# Patient Record
Sex: Female | Born: 1955 | Race: White | Hispanic: No | Marital: Married | Smoking: Never smoker
Health system: Southern US, Academic
[De-identification: ages and names within clinical notes are randomized; demographics above are authoritative.]

## PROBLEM LIST (undated history)

## (undated) DIAGNOSIS — J309 Allergic rhinitis, unspecified: Secondary | ICD-10-CM

## (undated) HISTORY — DX: Allergic rhinitis, unspecified: J30.9

---

## 1984-10-26 ENCOUNTER — Other Ambulatory Visit (HOSPITAL_COMMUNITY): Payer: Self-pay | Admitting: OBSTETRICS/GYNECOLOGY

## 2019-01-06 IMAGING — CR XRAY SINUSES COMPLETE
1 series · 3 of 3 positions shown · non-contrast
Comparison: None available.

EXAM:  XRAY SINUSES COMPLETE
INDICATION: Cough and shortness of breath.

[Series 1: view not recorded · 0.17mm/px · 3 of 3 slices shown]
[im 1/3]
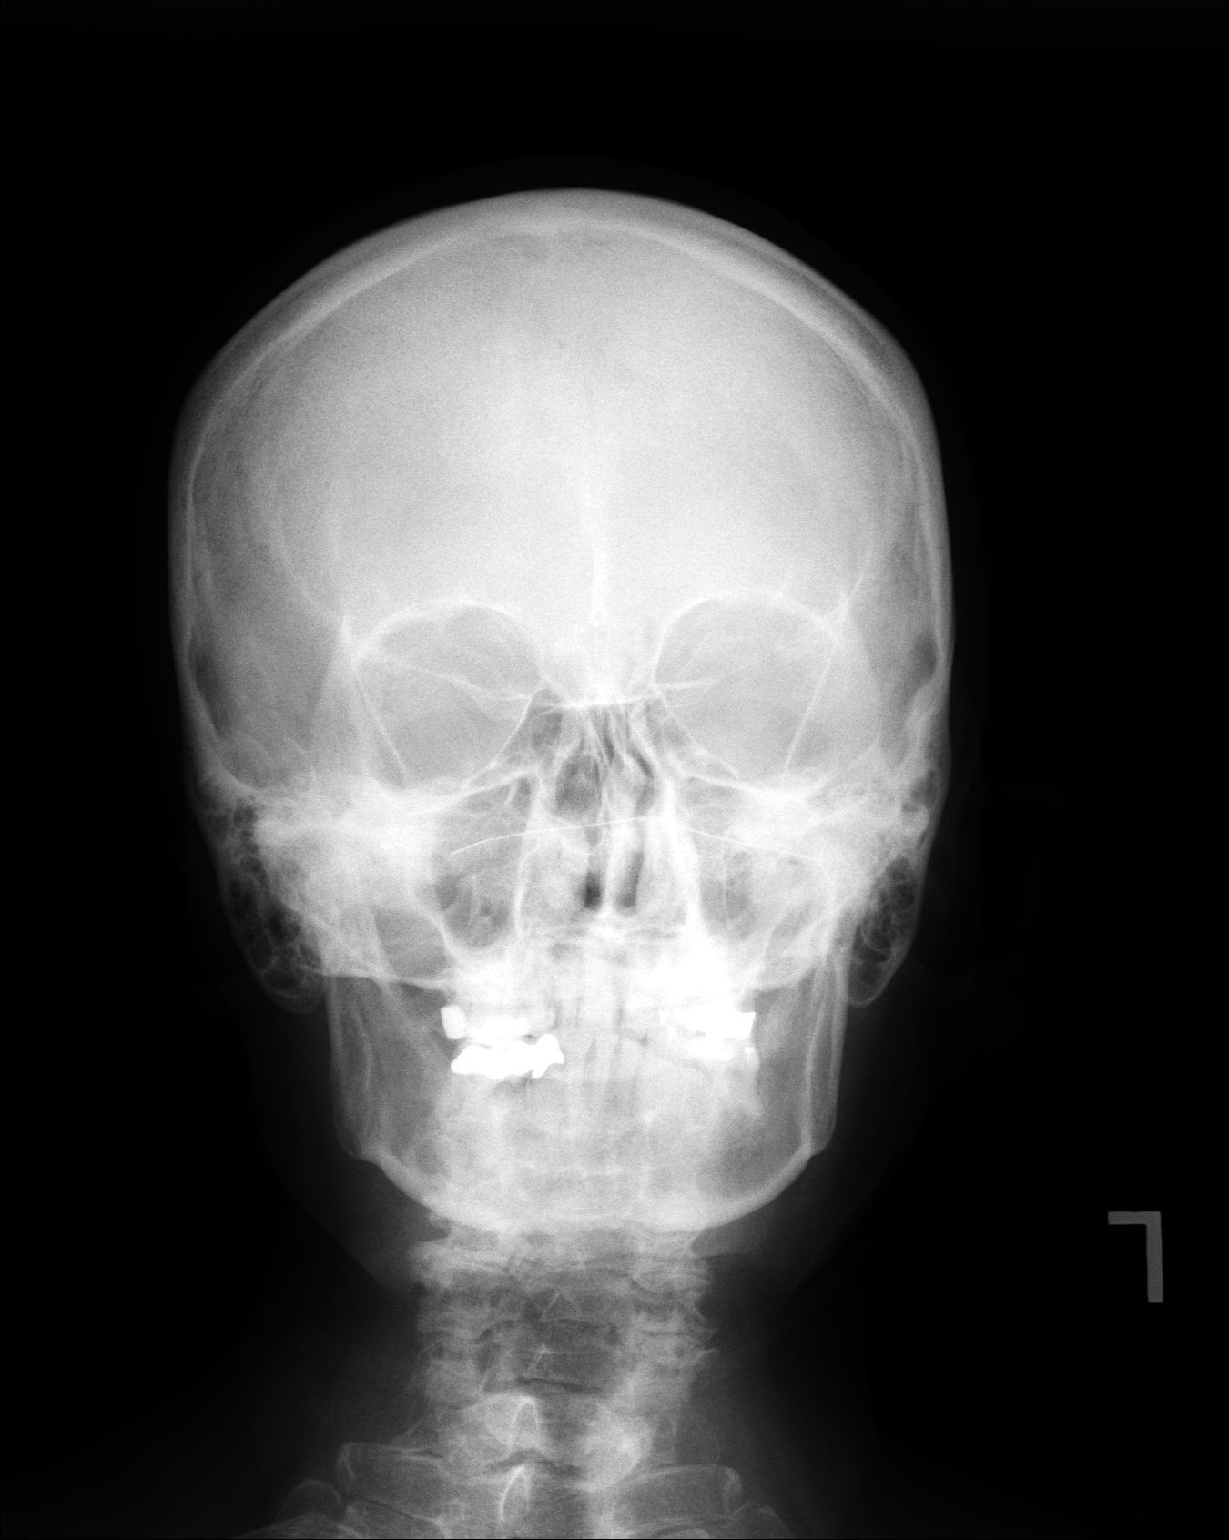
[im 2/3]
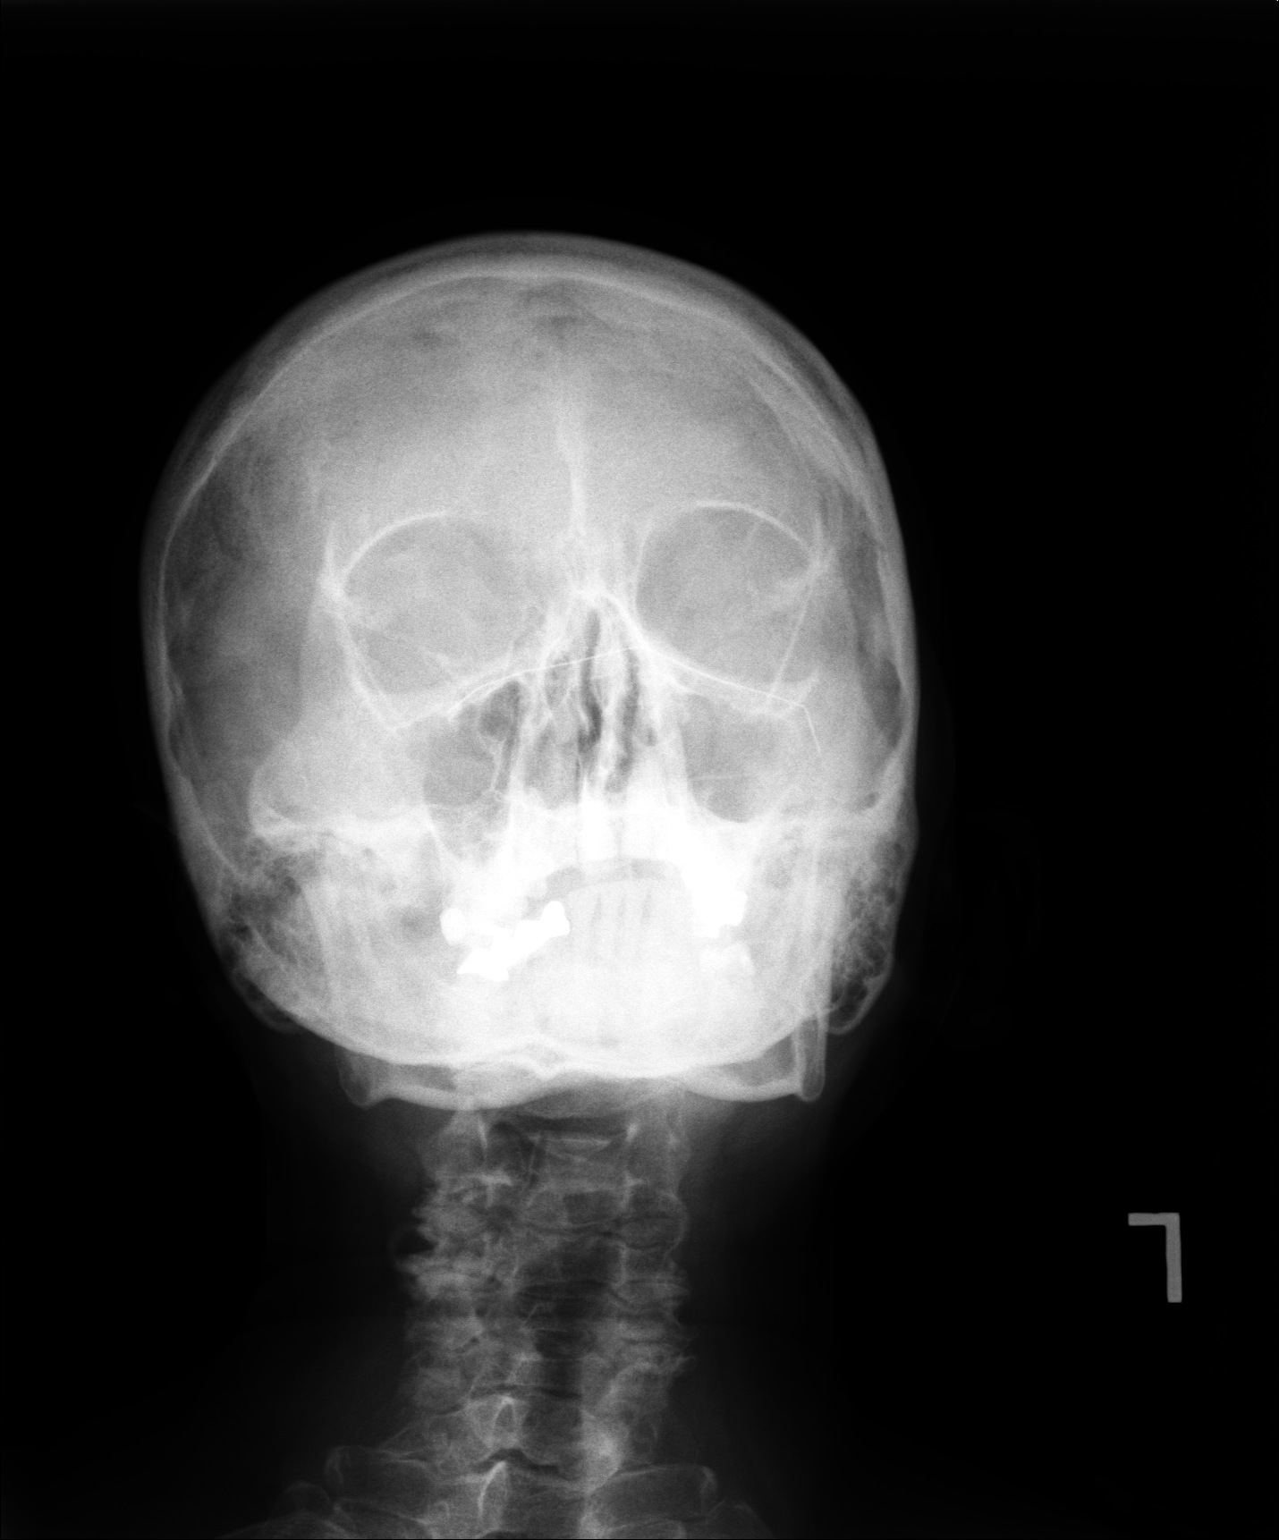
[im 3/3]
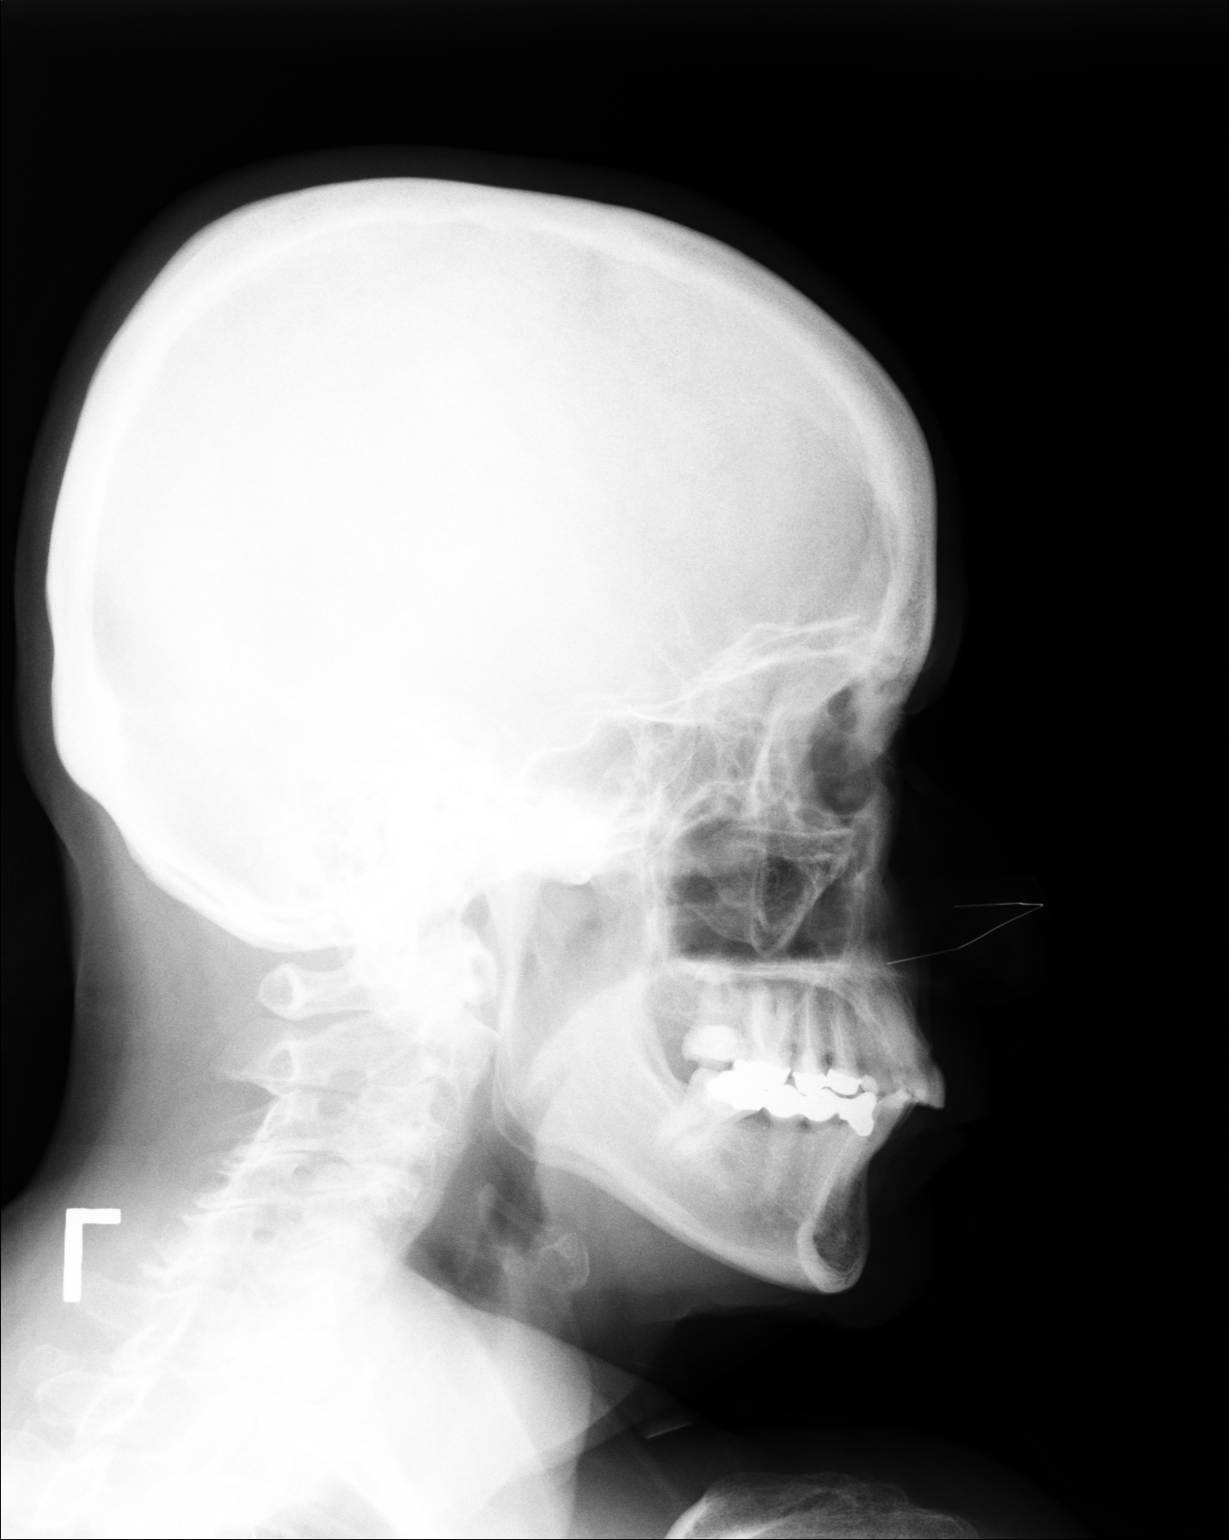

[3 of 3 positions shown; findings below may reference images not displayed]

FINDINGS: Frontal sinuses are congenitally hypoplastic. Remaining paranasal sinuses appear well aerated. There is mild leftward deviation of the nasal septum. Orbital margins are well maintained.
IMPRESSION: Essentially unremarkable exam.

## 2019-01-06 IMAGING — CR CHEST XRAY 2 VIEWS
1 series · 2 of 2 positions shown · non-contrast
Comparison: None available.

EXAM:  CHEST XRAY 2 VIEWS
INDICATION: Asthma.

[Series 3: view not recorded · 0.17mm/px · 2 of 2 slices shown]
[im 1/2]
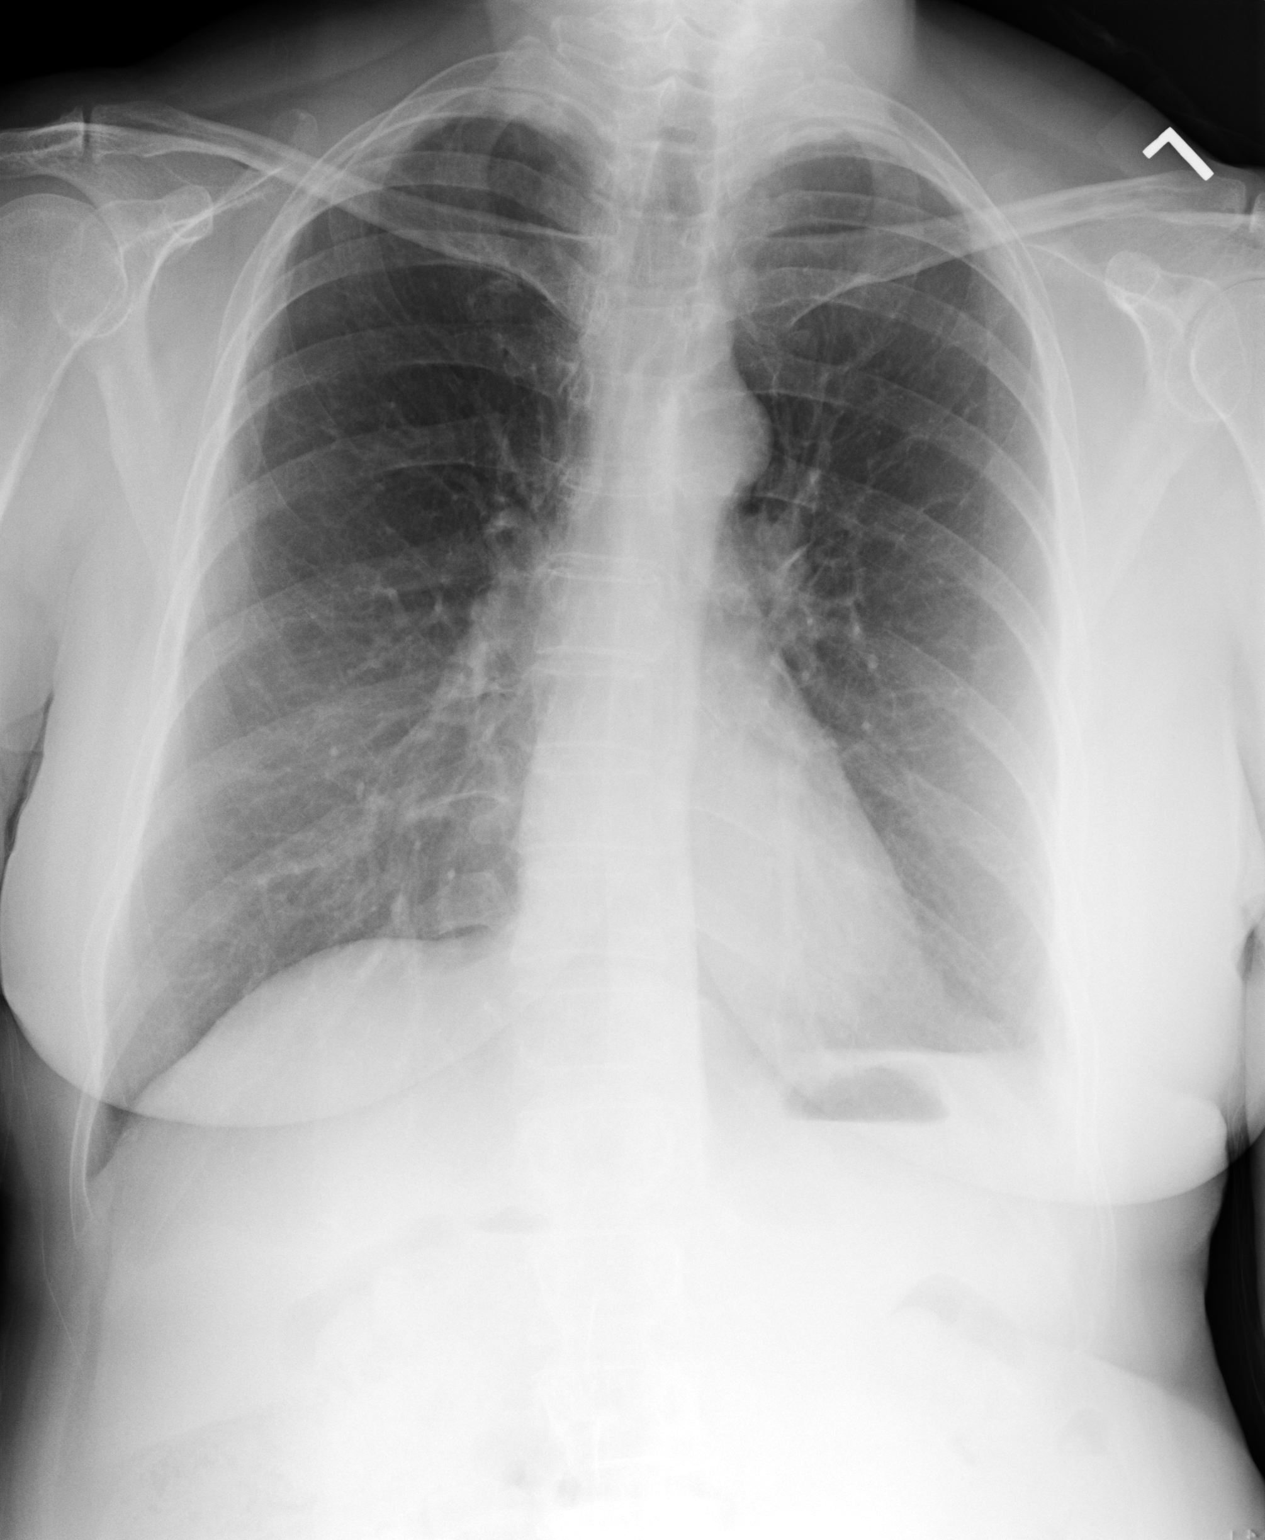
[im 2/2]
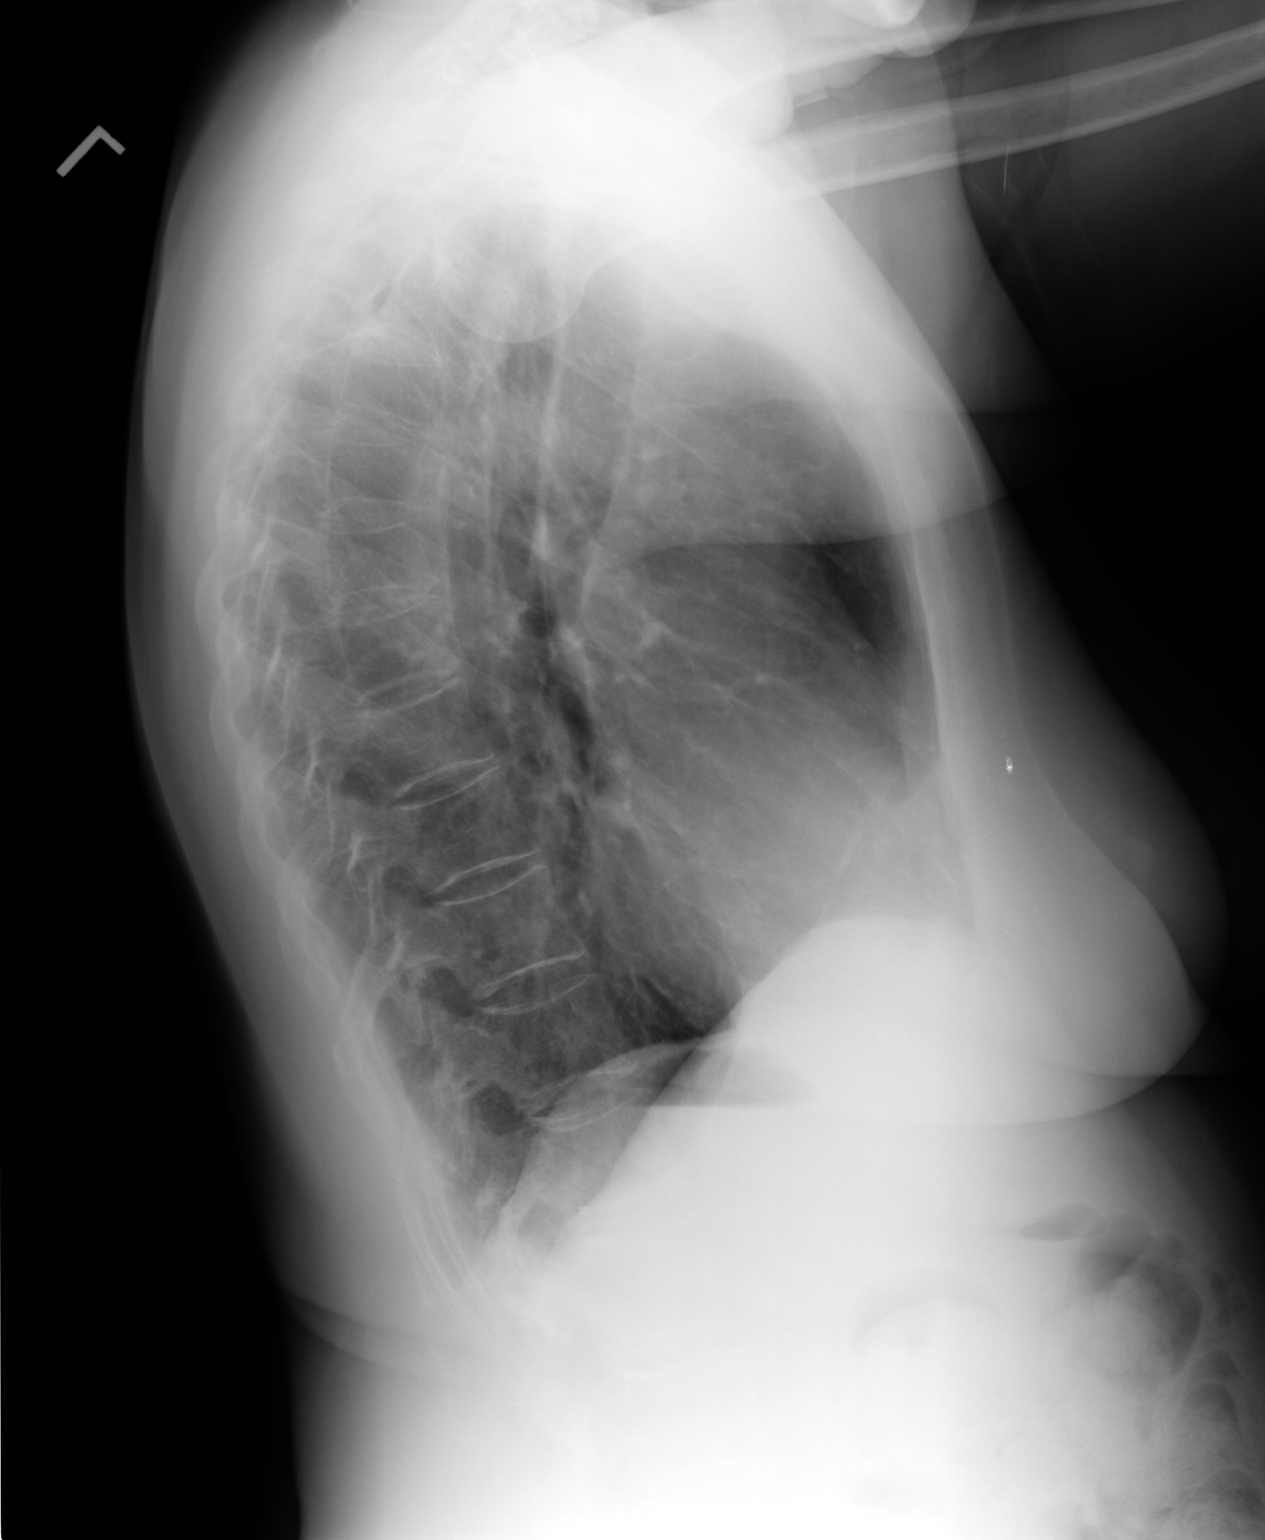

[2 of 2 positions shown; findings below may reference images not displayed]

FINDINGS: There is no consolidation or pleural effusion. Cardiomediastinal silhouette is normal. Visualized upper abdomen and osseous structures are unremarkable.
IMPRESSION: No acute cardiopulmonary abnormality.

## 2019-12-02 IMAGING — MR MRI JOINT UPPER EXTREMITY WITHOUT CONTRAST RT
5 of 6 series · 31 of 40 positions shown · non-contrast
Comparison: None available.

EXAM:  MRI JOINT UPPER EXTREMITY WITHOUT CONTRAST RT SHOULDER
INDICATION: 63 year old female with persistent right shoulder pain and diminished range of motion.  Patient fell from treadmill approximately 4 months ago.  No history of shoulder surgery.
TECHNIQUE: Axial, oblique coronal and oblique sagittal images of the right shoulder as per protocol.

[Series 7: T1 · oblique · right · 4.0mm · 0.31mm/px · 6 of 22 slices shown]
[im 1/22]
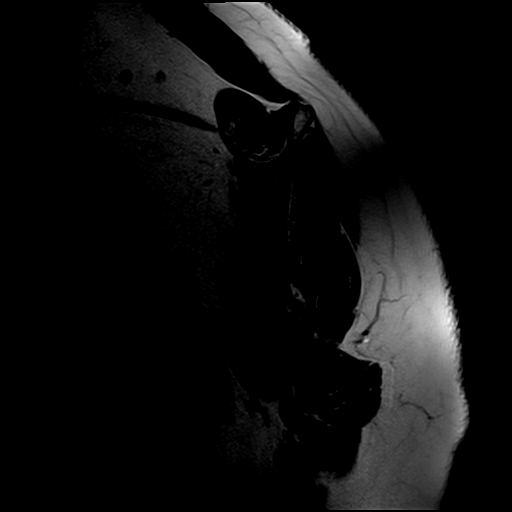
[im 5/22]
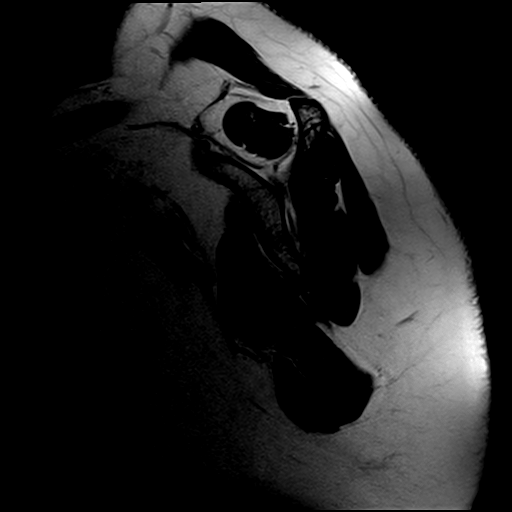
[im 9/22]
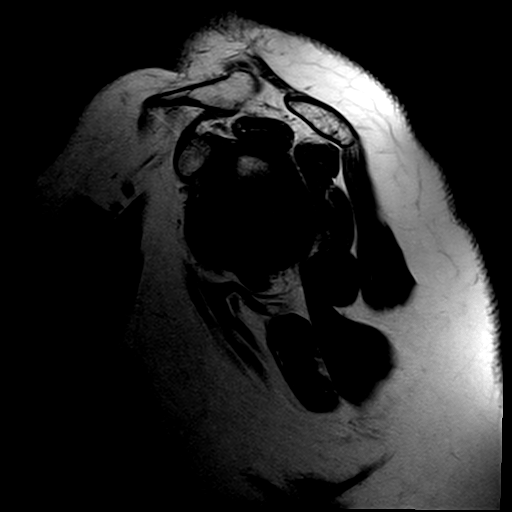
[im 13/22]
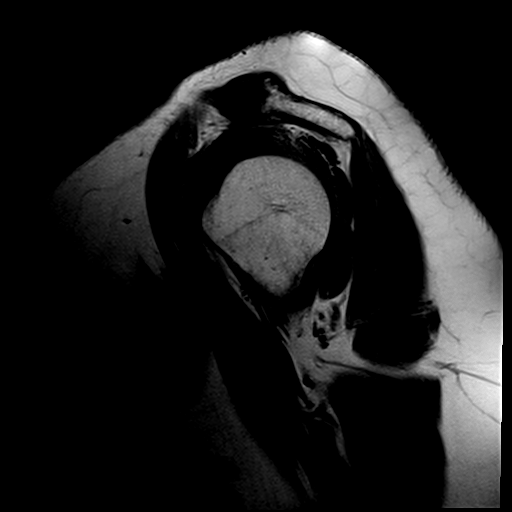
[im 17/22]
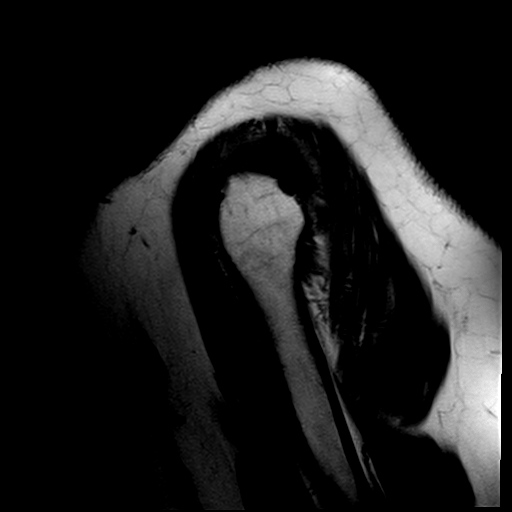
[im 22/22]
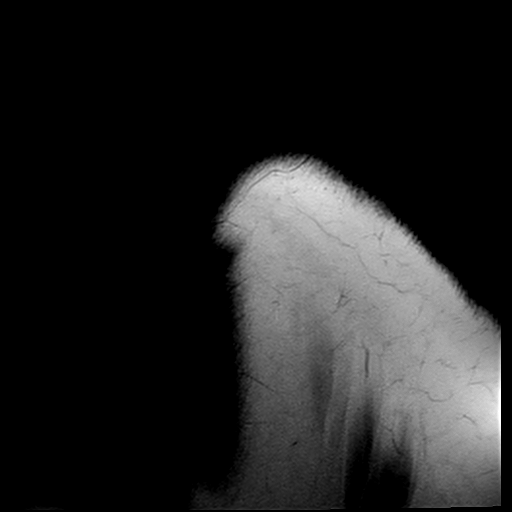

[Series 8: PD fat-sat · axial · right · 4.0mm · 0.36mm/px · z∈[-38,+64]mm · 7 of 24 slices shown (1 of 2)]
[im 1/24]
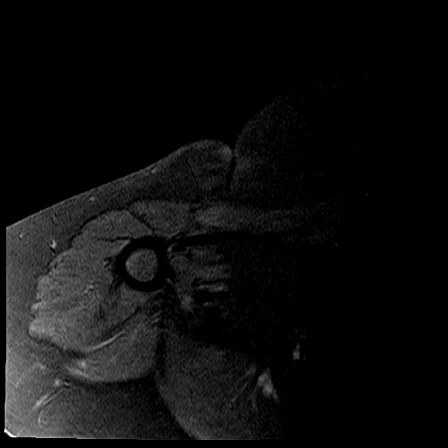
[im 4/24]
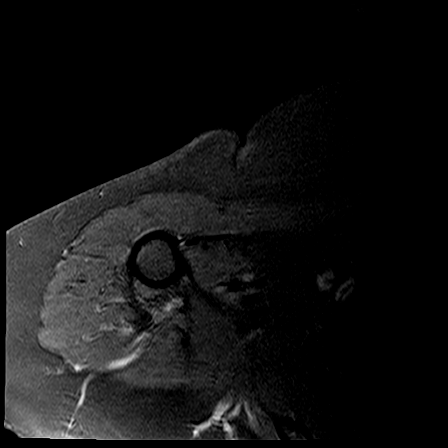
[im 8/24]
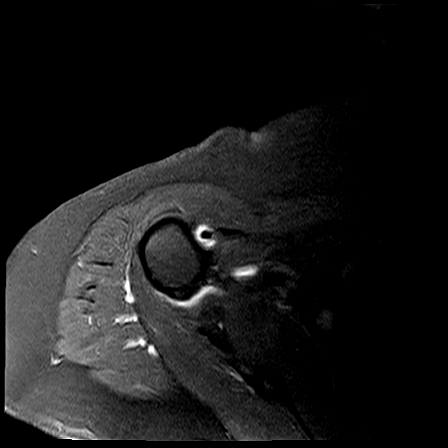
[im 12/24]
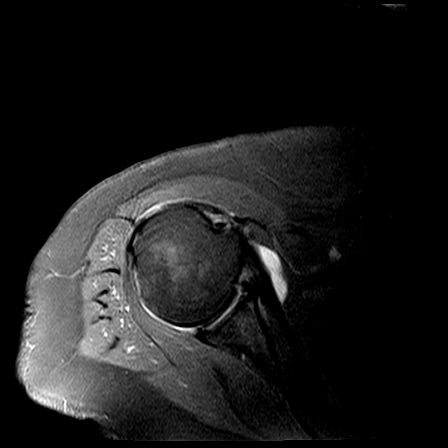
[im 16/24]
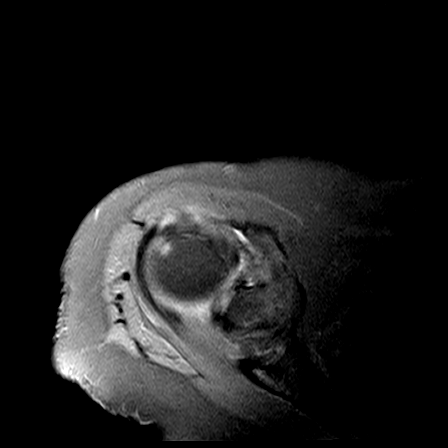
[im 20/24]
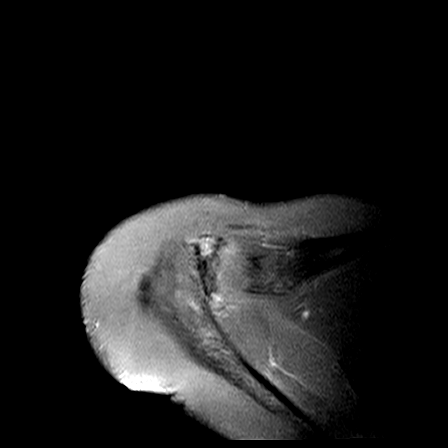
[im 24/24]
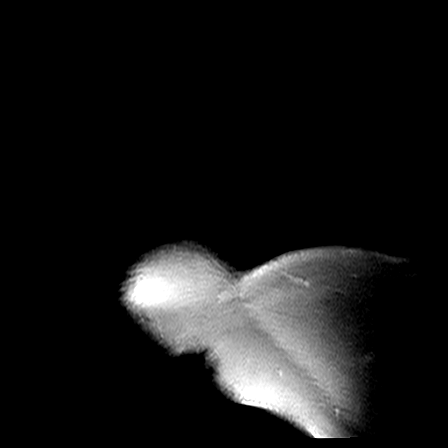

[Series 11: PD fat-sat · oblique · right · 3.5mm · 0.47mm/px · 7 of 23 slices shown (2 of 2)]
[im 1/23]
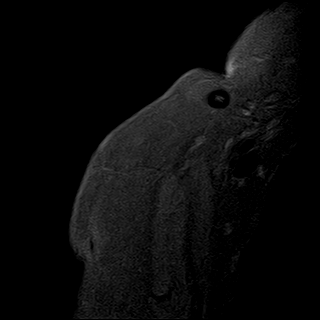
[im 4/23]
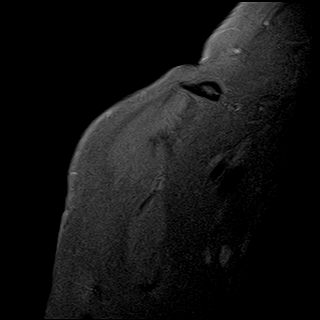
[im 8/23]
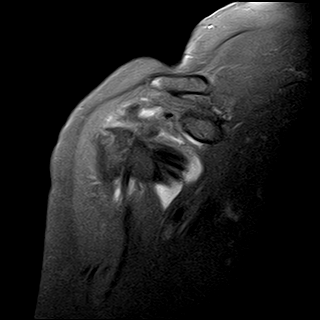
[im 12/23]
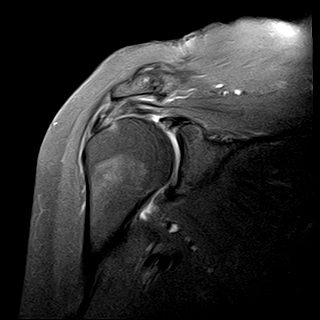
[im 15/23]
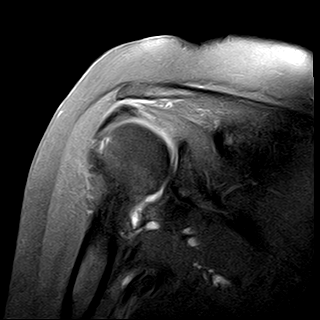
[im 19/23]
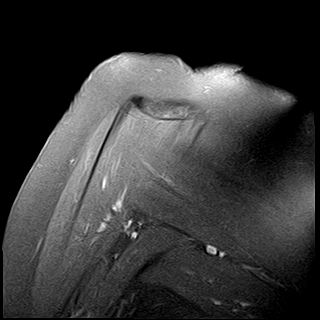
[im 23/23]
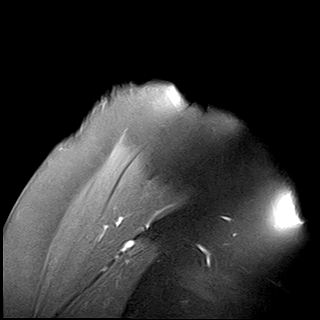

[Series 12: STIR · oblique · right · 3.5mm · 0.52mm/px · 4 of 23 slices shown]
[im 1/23]
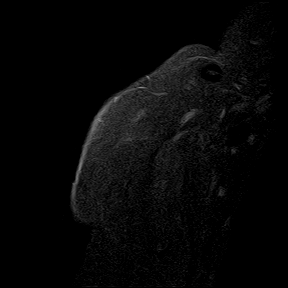
[im 4/23]
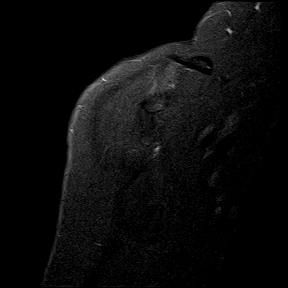
[im 8/23]
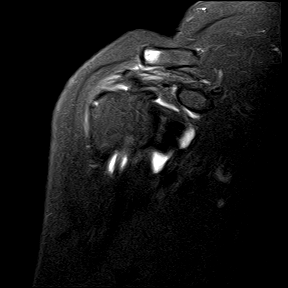
[im 12/23]
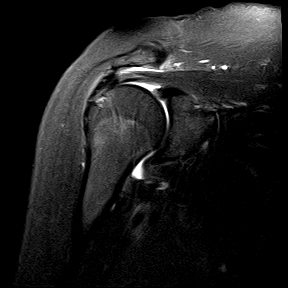

[Series 13: T2 fat-sat · axial · right · 4.0mm · 0.42mm/px · z∈[-38,+64]mm · 7 of 24 slices shown]
[im 1/24]
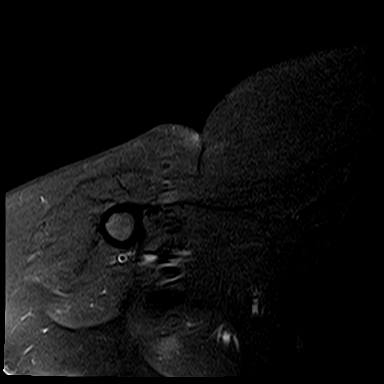
[im 4/24]
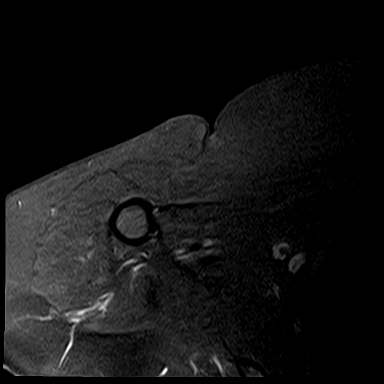
[im 8/24]
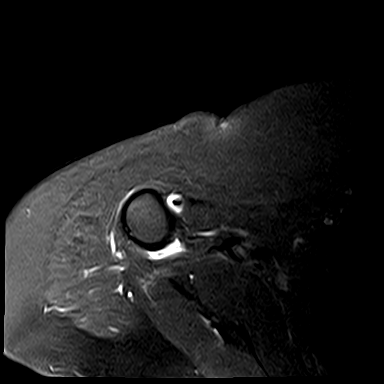
[im 12/24]
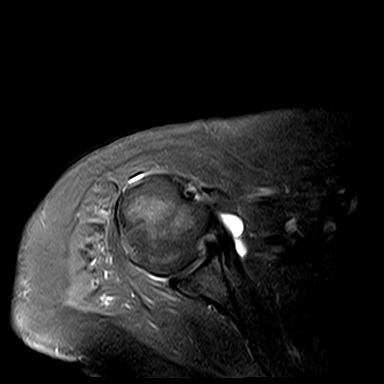
[im 16/24]
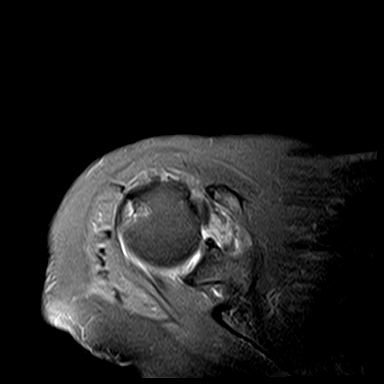
[im 20/24]
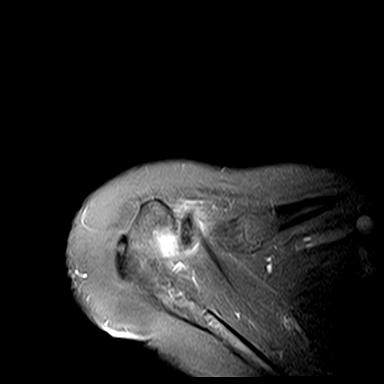
[im 24/24]
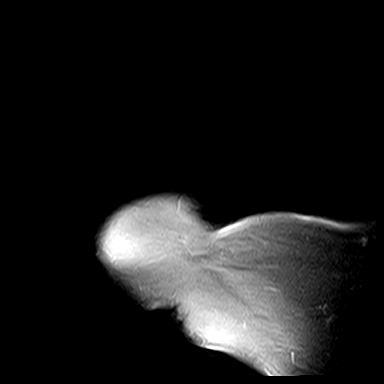

[31 of 40 positions shown; findings below may reference images not displayed]

FINDINGS: Some of the images are compromised by motion artifacts.  

No acute fractures or bone bruises on both sides of the right shoulder.  

Degenerative changes of AC joint is impinging on the subacromion space in the supraspinatus.  Mild supraspinatus tendonitis is noted.  Minimal partial thickness tear on the bursal side of supraspinatus is noted.  

No acute abnormalities of the glenoid labrum.  Biceps tendon is intact.  Fluid in the glenohumeral joint is noted.  No loose bodies are seen.
IMPRESSION: No acute bone changes at the right shoulder.  

Chronic degenerative changes impinging on the subacromion space in the supraspinatus muscle and tendon.

Supraspinatus tendonitis with shallow partial thickness tear on the bursal side.  No full thickness tear are noted.  

Effusion in the glenohumeral joint.  No evidence of labral tear.  Moderate degenerative changes of the glenohumeral articular cartilage and the labrum.

## 2020-08-01 IMAGING — US ABD COMPLETE
1 series · 14 of 25 positions shown · non-contrast
Comparison: None available.

﻿EXAM:  ERXLEBEN PROFESSIONAL READ ABD U/S COMPLETE
INDICATION: R10.84.

[Series 1: abd complete · 14 of 71 slices shown]
[im 1/71]
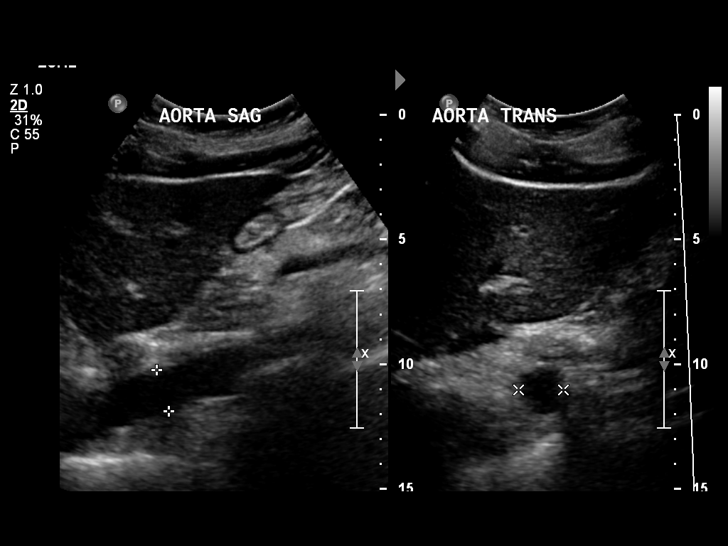
[im 6/71]
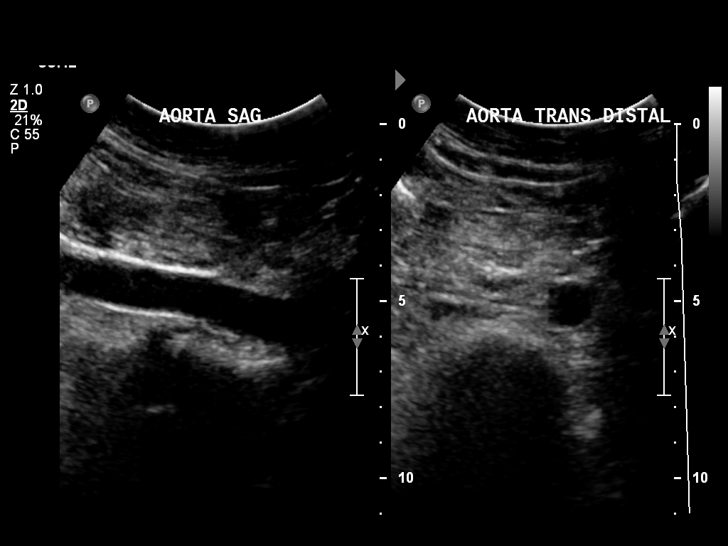
[im 12/71]
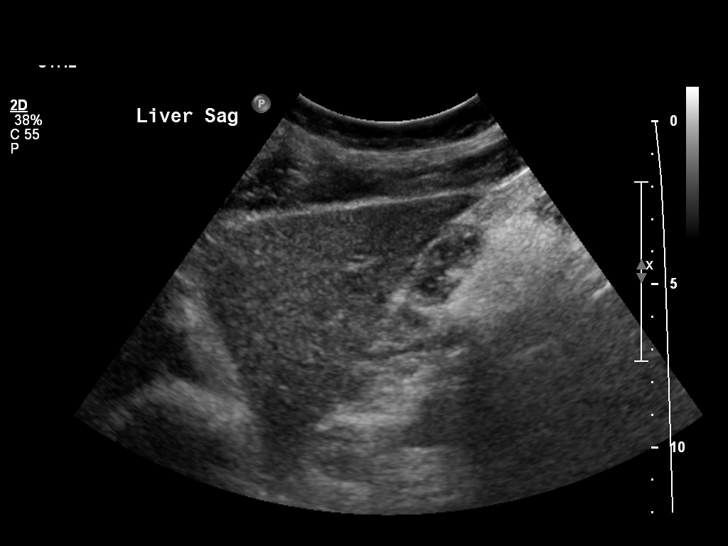
[im 18/71]
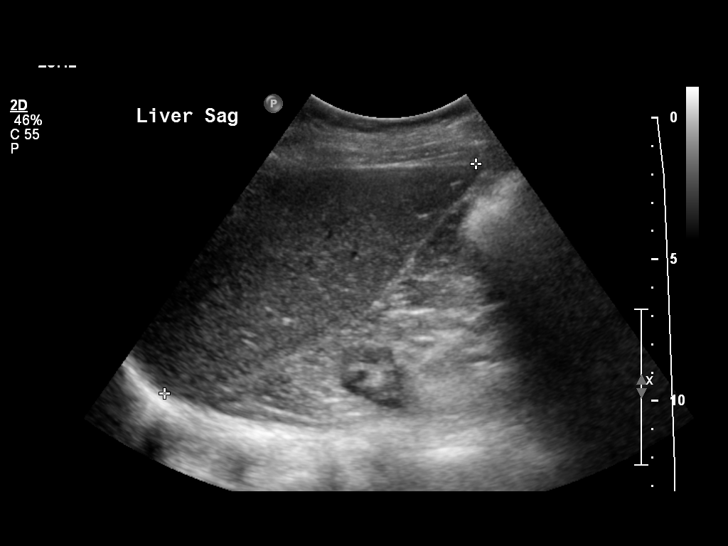
[im 24/71]
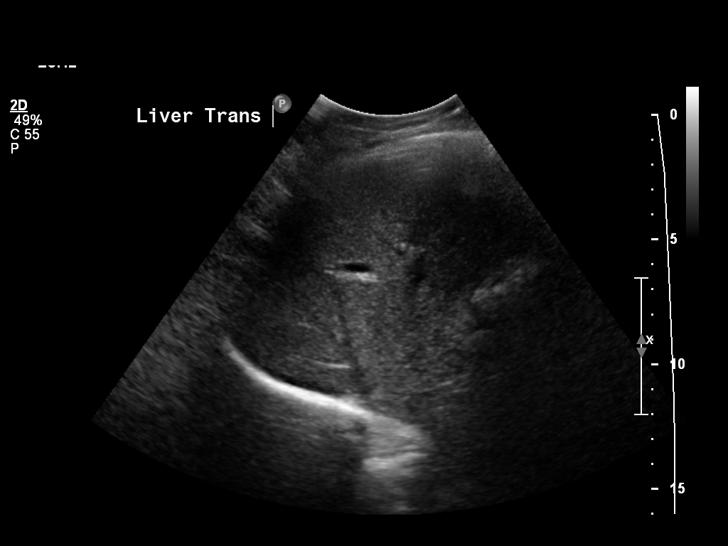
[im 27/71]
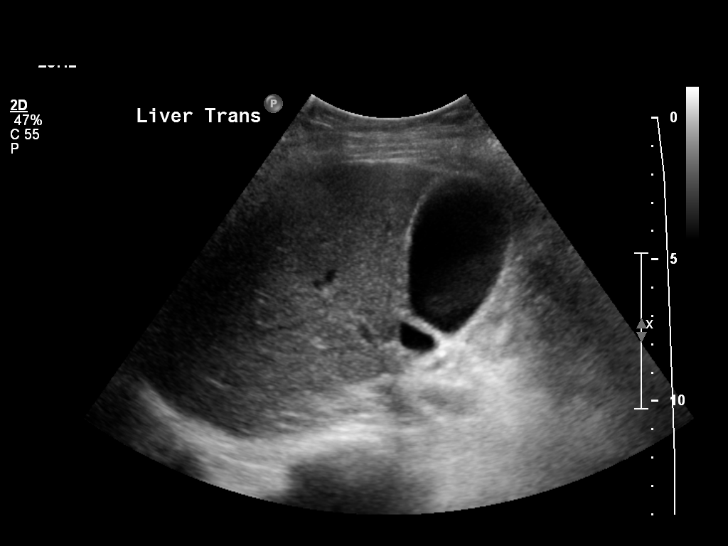
[im 33/71]
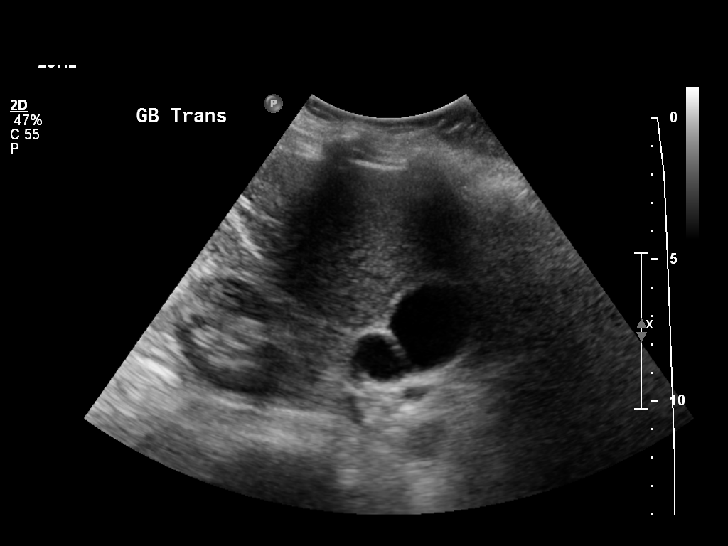
[im 38/71]
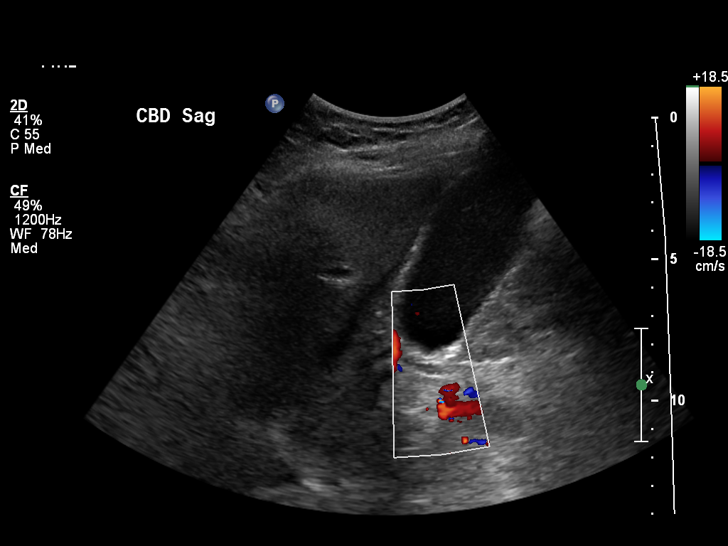
[im 44/71]
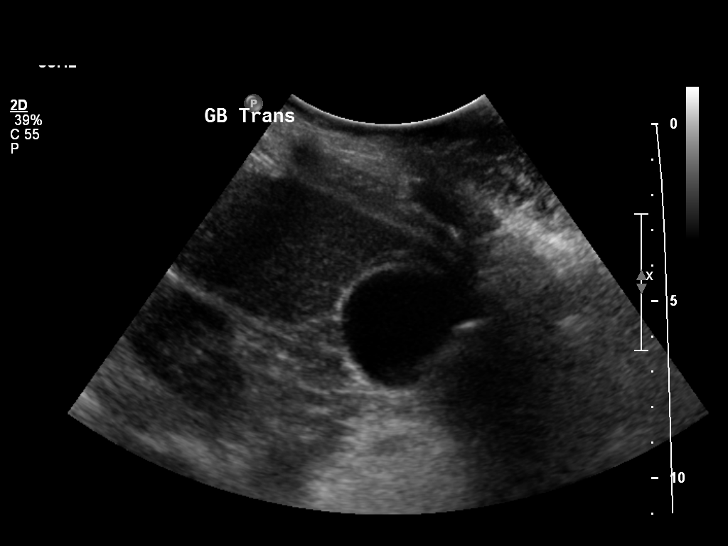
[im 47/71]
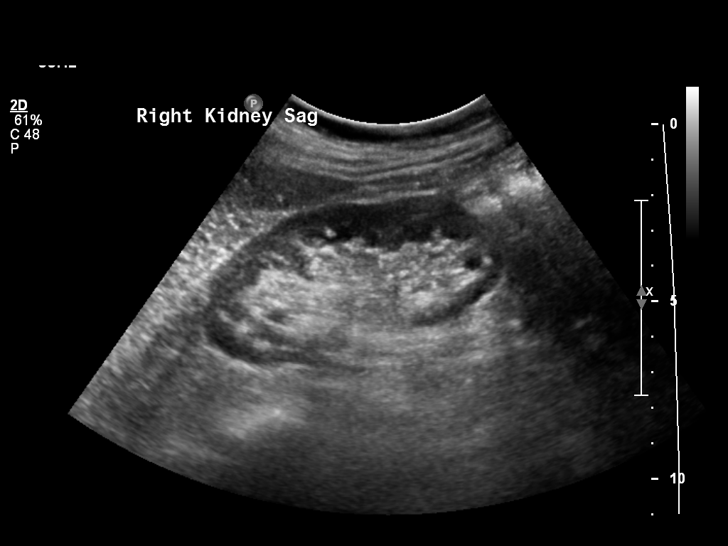
[im 53/71]
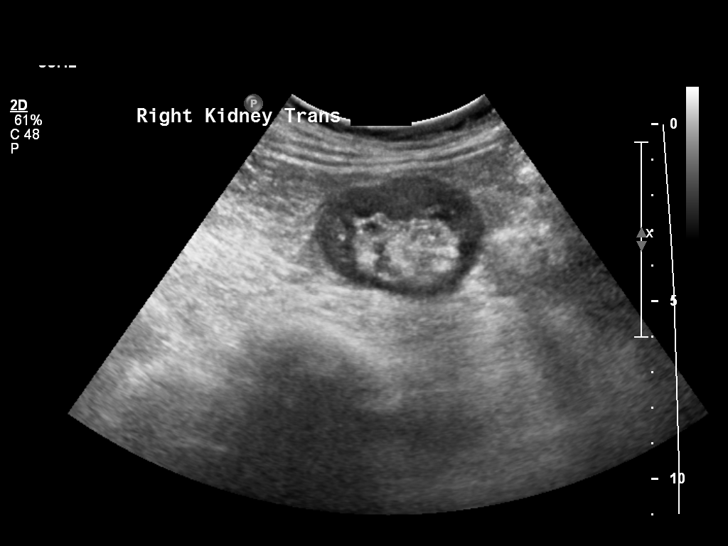
[im 59/71]
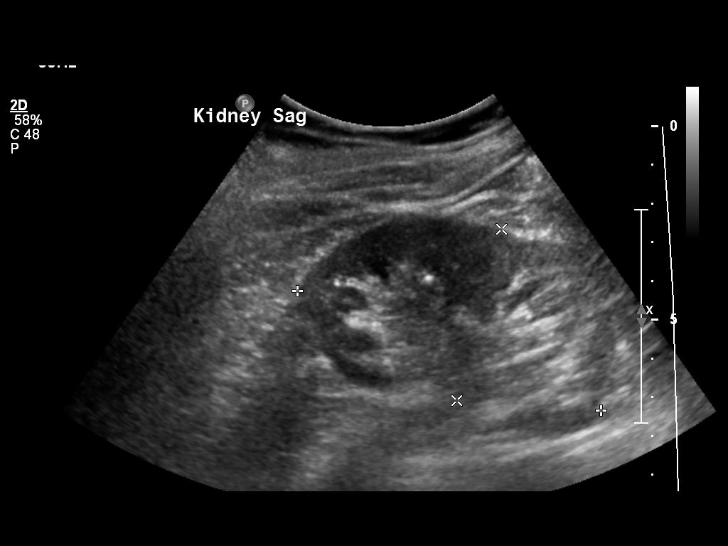
[im 65/71]
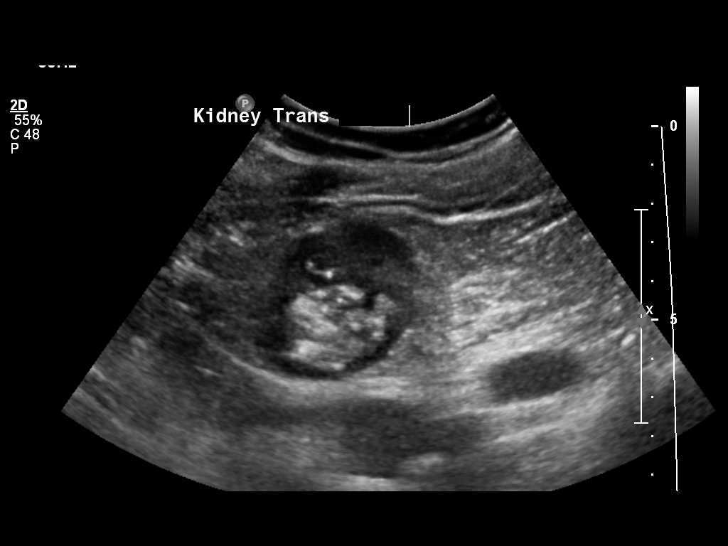
[im 71/71]
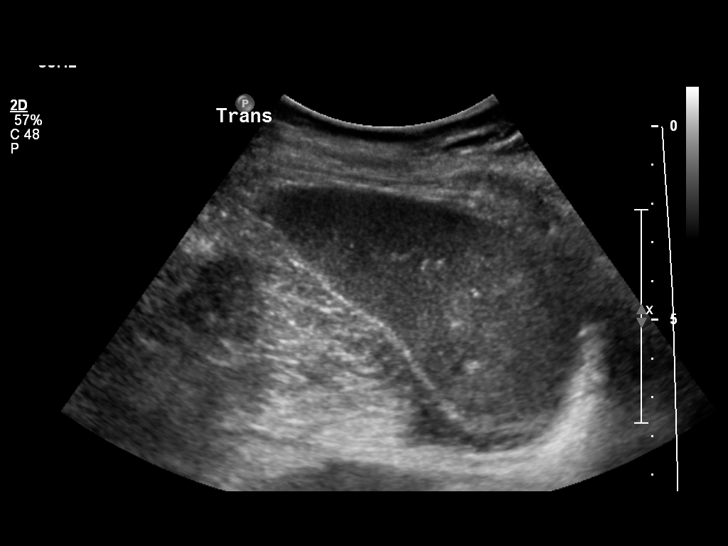

[14 of 25 positions shown; findings below may reference images not displayed]

FINDINGS: Liver is normal in echogenicity. There is no hepatic mass. There is no intra or extrahepatic biliary ductal dilatation. Common bile duct measures 3.5 mm. No sludge or shadowing gallstones are seen. There is no gallbladder wall thickening or pericholecystic fluid. Pancreas is incompletely visualized due to artifact from overlying bowel gas. Spleen measures 9.5 cm and is unremarkable.

 Kidneys are normal in echogenicity and measure 8.5 cm bilaterally. There is no hydronephrosis, mass or cyst on either side.

Visualized abdominal aorta is without aneurysmal dilatation. IVC is normal. Portal vein measures 7 mm in diameter and demonstrates hepatopetal flow. Hepatic veins are also patent. There is no ascites.
IMPRESSION: 1. Unremarkable liver. 

2. No evidence of cholelithiasis or acute cholecystitis. 

3. Pancreas incompletely visualized due to artifact from overlying bowel gas.

## 2020-11-15 IMAGING — CR XRAY SHOULDER MINIMUM 2 VIEW RT
1 series · 3 of 3 positions shown · non-contrast
Comparison: MRI dated 12/02/2019.

﻿EXAM:  XRAY SHOULDER MINIMUM 2 VIEW RT
INDICATION: Shoulder pain.

[Series 1: view not recorded · 0.17mm/px · 3 of 3 slices shown]
[im 1/3]
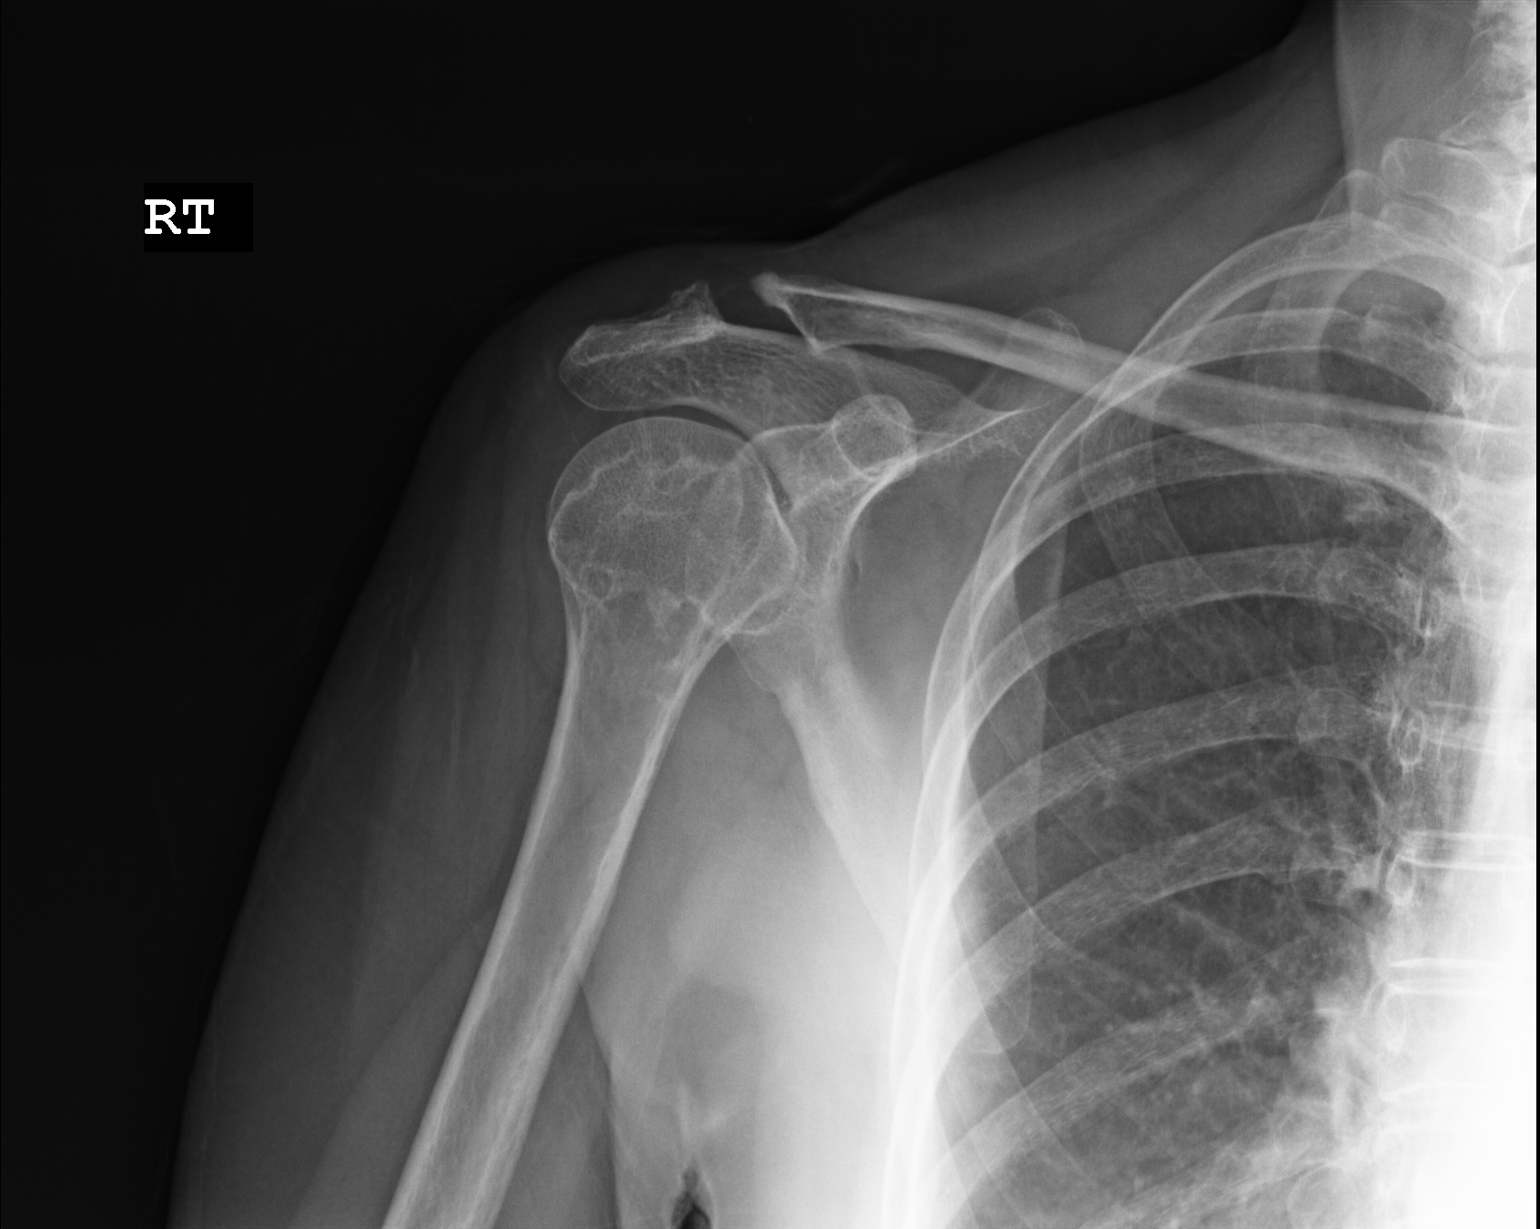
[im 2/3]
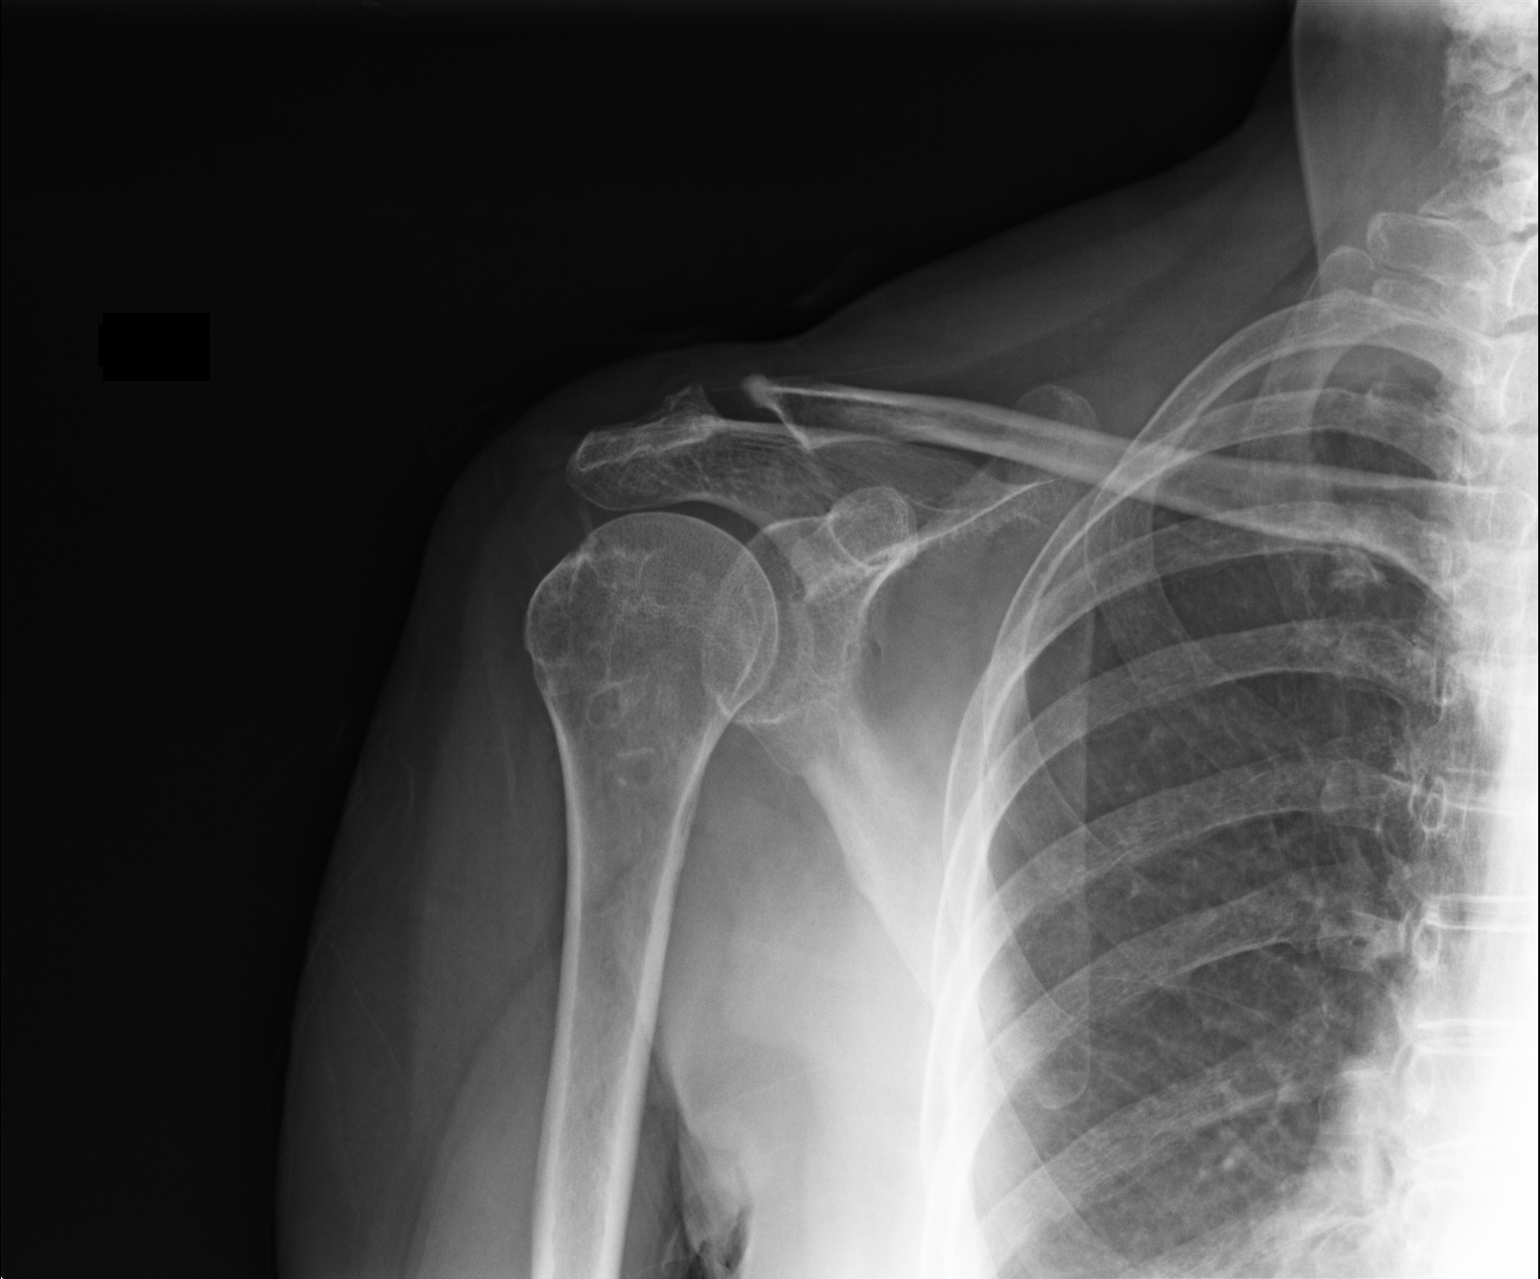
[im 3/3]
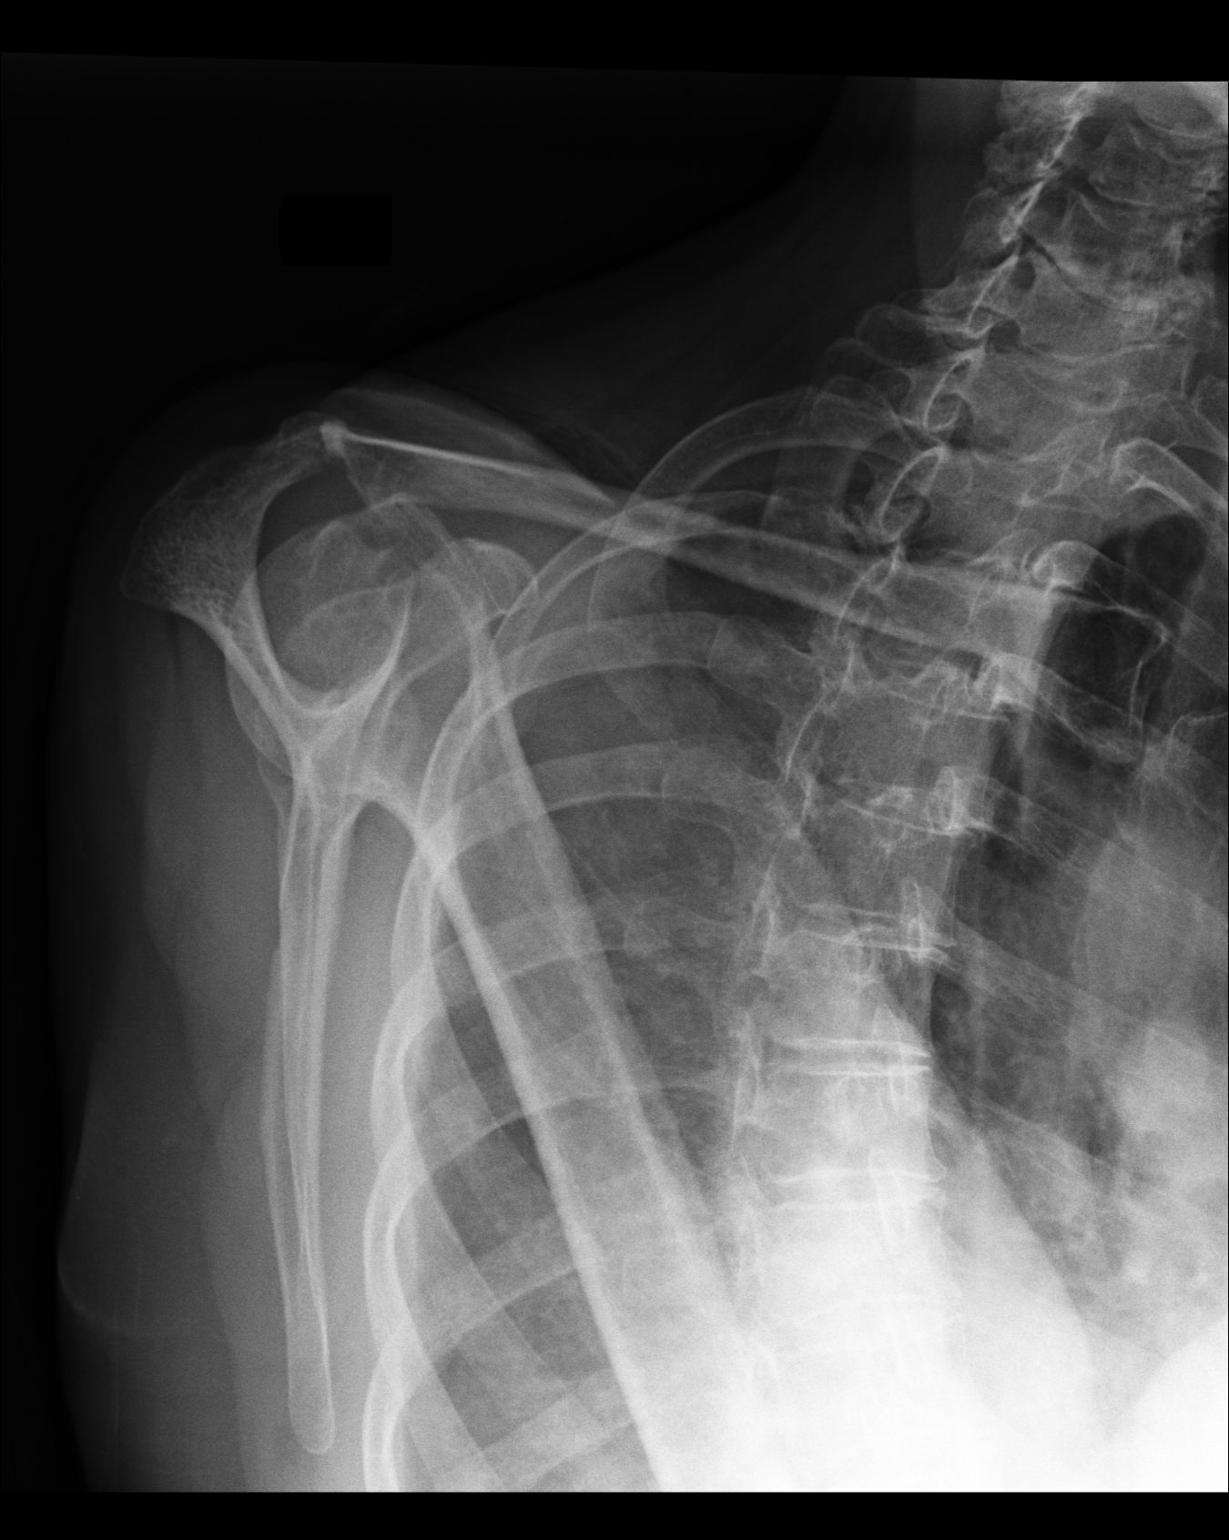

[3 of 3 positions shown; findings below may reference images not displayed]

FINDINGS: There is no acute fracture or subluxation. Glenohumeral articulation is well maintained. There appears to be surgical resection of the distal clavicle. Visualized lung is clear. Surrounding soft tissues are unremarkable.
IMPRESSION: No acute osseous abnormality.

## 2020-12-26 IMAGING — MR ARTHROGRAM MRI RT SHOULDER
5 of 6 series · 28 of 40 positions shown · IV contrast (Gadavist)
Comparison: Radiographs dated 11/15/2020 and MRI dated 12/02/2019.

﻿EXAM:  ARTHROGRAM MRI RT SHOULDER
INDICATION: Recurrent shoulder pain. History of prior surgery.
TECHNIQUE: Multiplanar multisequential MRI of the right shoulder joint was performed following intra-articular administration of 15 mL of diluted Gadavist.

[Series 6: T1 fat-sat · oblique · right · 3.5mm · 0.31mm/px · 6 of 22 slices shown (1 of 3)]
[im 1/22]
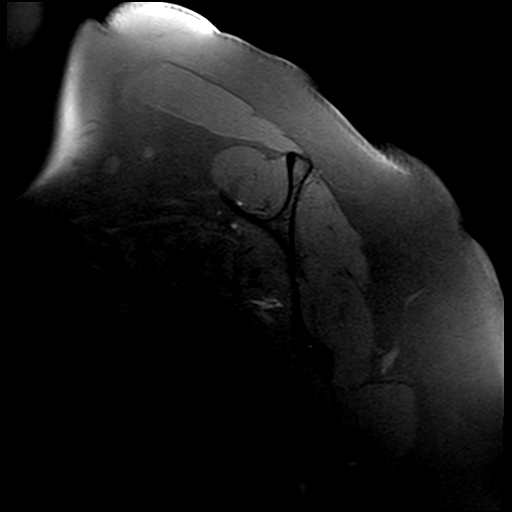
[im 5/22]
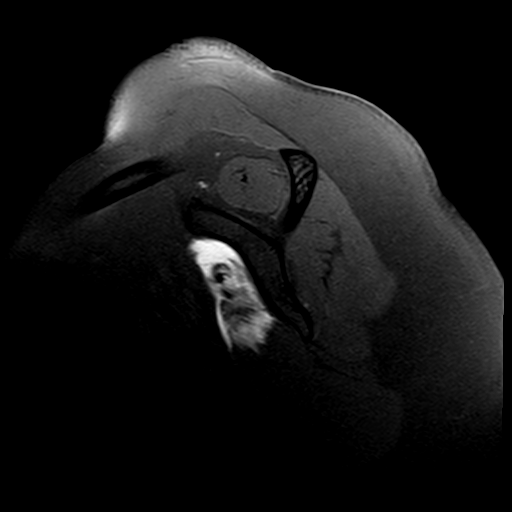
[im 9/22]
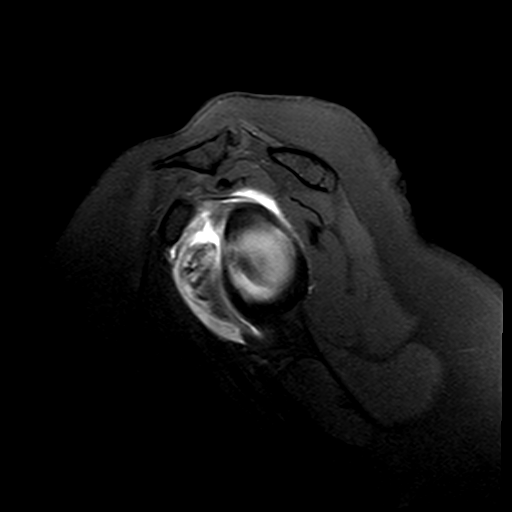
[im 13/22]
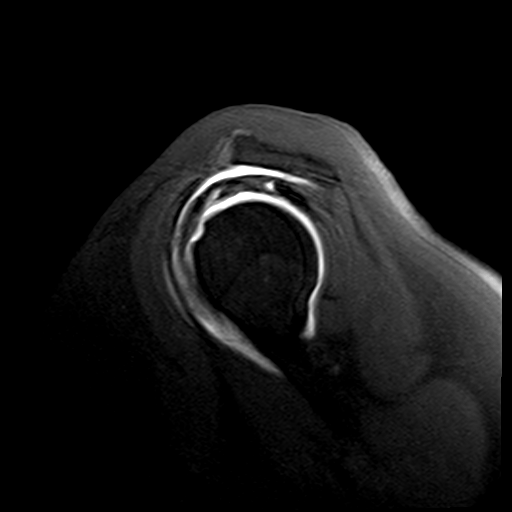
[im 17/22]
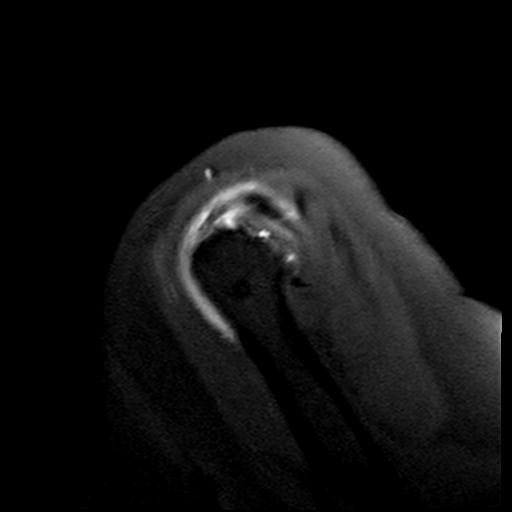
[im 22/22]
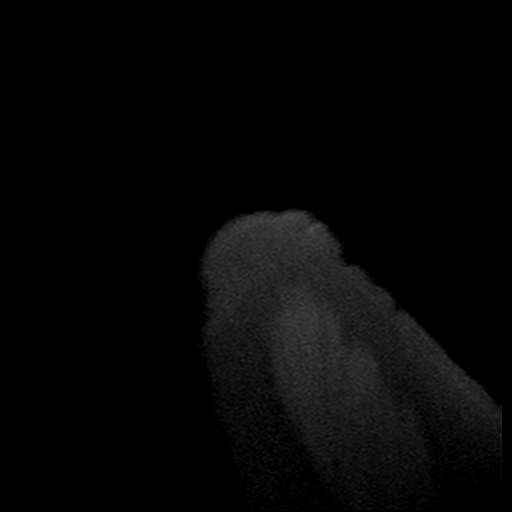

[Series 7: T1 fat-sat · axial · right · 4.0mm · 0.50mm/px · z∈[-39,+44]mm · 6 of 18 slices shown (2 of 3)]
[im 1/18]
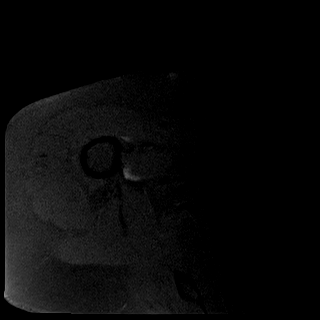
[im 4/18]
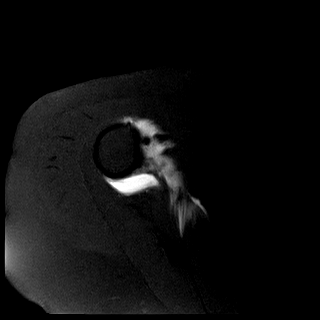
[im 7/18]
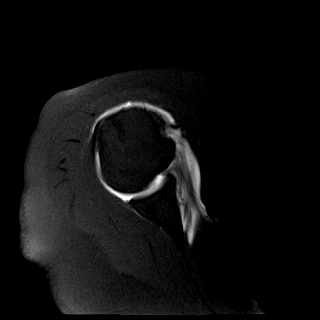
[im 11/18]
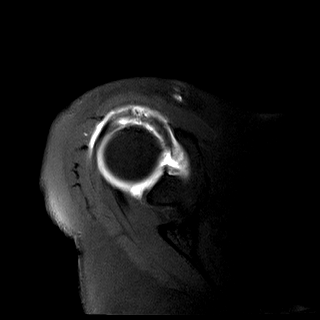
[im 14/18]
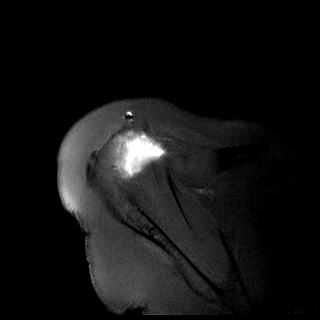
[im 18/18]
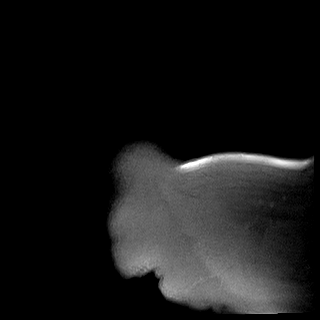

[Series 8: T1 fat-sat · oblique · right · 3.5mm · 0.31mm/px · 2 of 20 slices shown (3 of 3)]
[im 1/20]
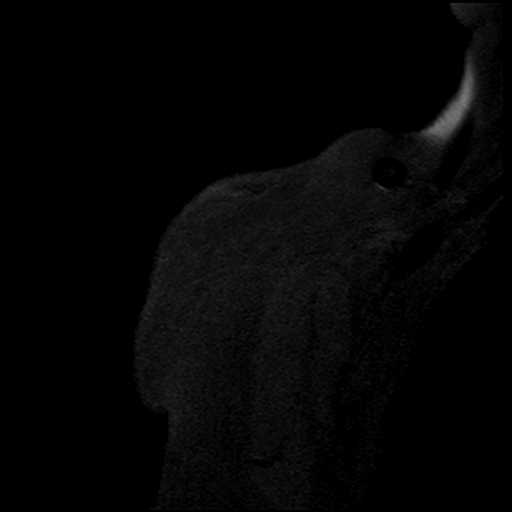
[im 4/20]
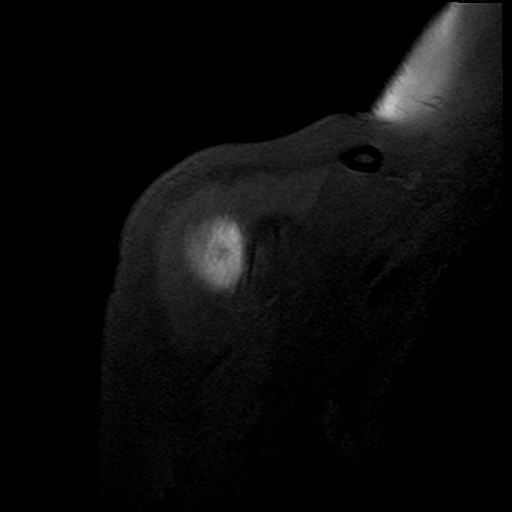

[Series 9: PD fat-sat · oblique · right · 3.5mm · 0.50mm/px · 7 of 20 slices shown]
[im 1/20]
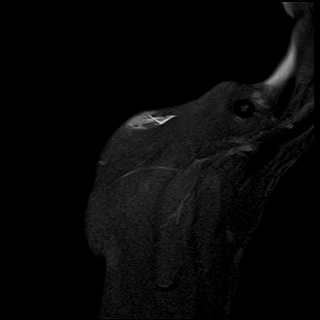
[im 4/20]
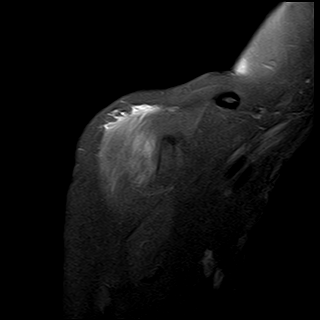
[im 7/20]
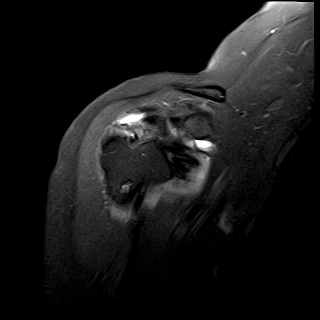
[im 10/20]
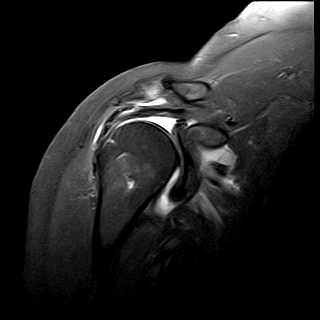
[im 13/20]
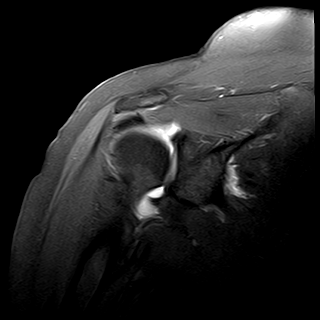
[im 16/20]
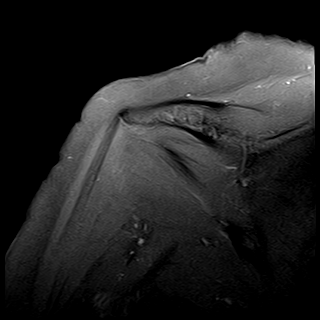
[im 20/20]
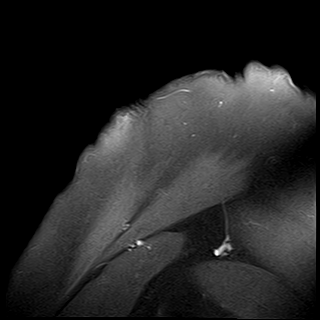

[Series 10: T2 fat-sat · oblique · right · 3.5mm · 0.36mm/px · 7 of 20 slices shown]
[im 1/20]
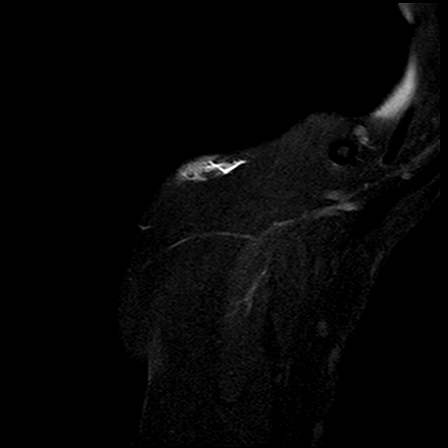
[im 4/20]
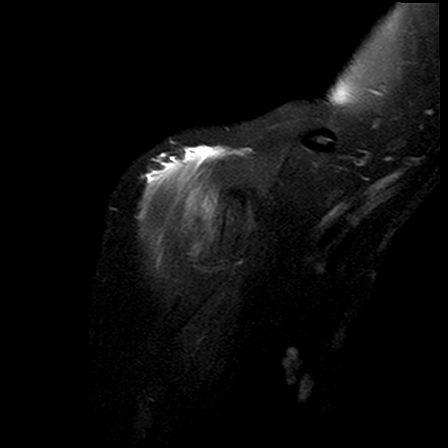
[im 7/20]
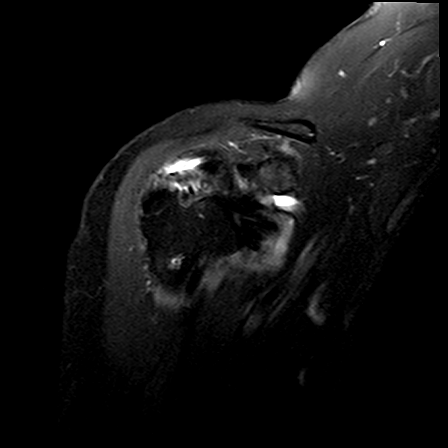
[im 10/20]
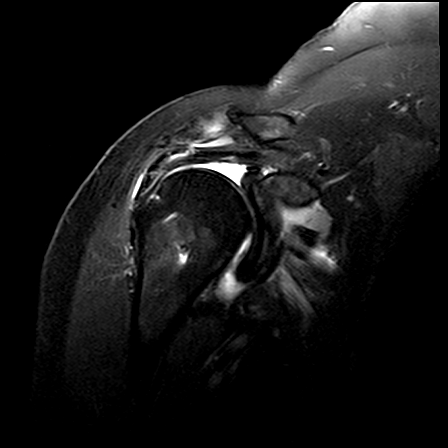
[im 13/20]
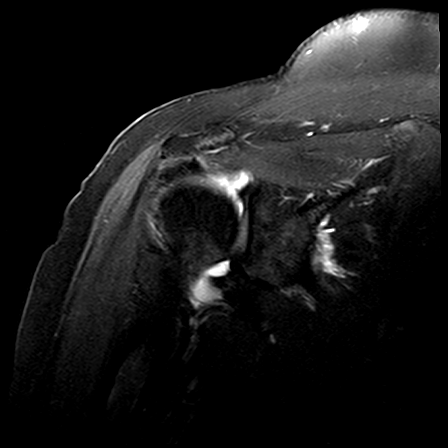
[im 16/20]
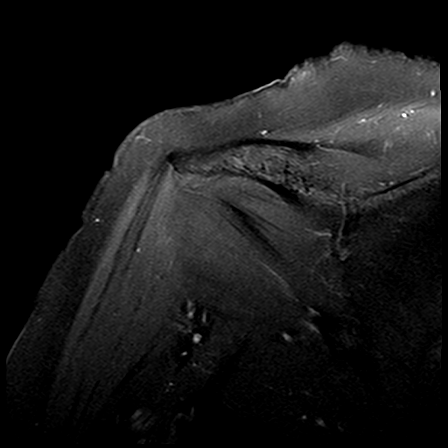
[im 20/20]
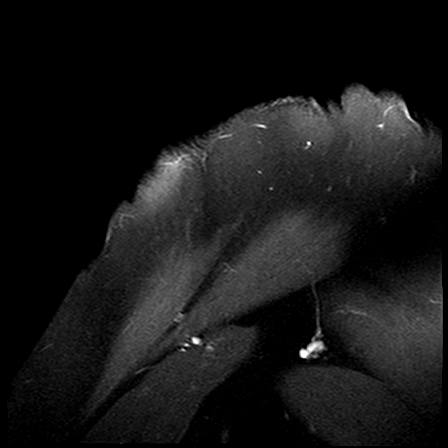

[28 of 40 positions shown; findings below may reference images not displayed]

FINDINGS: There is a recurrent full-thickness supraspinatus tendon tear. Mild infraspinatus and moderate subscapularis tendinopathy is also identified. Teres minor muscle and tendon is unremarkable. Surgical anchors are noted within the humeral head. Long head of biceps tendon is not visualized within the intertubercular groove and may have been surgically affixed to the proximal humerus. Superior labrum is deformed. Glenohumeral articular cartilage appears well maintained. Surgical resection of the distal clavicle is identified. Muscle bulk and bone marrow signal intensity are normal. No mass is seen along the course of the suprascapular nerve, within the spinoglenoid notch or within the quadrilateral space.
IMPRESSION: 1. Recurrent full-thickness supraspinatus tendon tear. 

2. Moderate subscapularis and mild infraspinatus tendinopathy. 

3. Long head of biceps tendon visualized at the level of the proximal humerus and may have been surgically affixed to the humerus at that level. 

4. Deformity of the superior labrum. 

5. Surgical resection of the distal clavicle.

## 2021-07-25 ENCOUNTER — Other Ambulatory Visit: Payer: Self-pay

## 2021-07-25 ENCOUNTER — Ambulatory Visit (INDEPENDENT_AMBULATORY_CARE_PROVIDER_SITE_OTHER): Payer: 59

## 2021-07-25 DIAGNOSIS — J309 Allergic rhinitis, unspecified: Secondary | ICD-10-CM

## 2021-07-25 NOTE — Nursing Note (Signed)
07/25/21 1300   Vial A   Set 0   Injection Laterality Left   Injection Site Upper Arm   Dose  0.5   Time Observed 20 Minutes   Injection Site Reaction Negative   Vial B   Set 1   Injection Laterality Left   Injection Site Lower Arm   Dose  0.5   Time Observed 20 Minutes   Injection Site Reaction Negative     Merlinda Frederick, LPN  0/07/2120, 48:25

## 2021-07-31 ENCOUNTER — Other Ambulatory Visit (HOSPITAL_COMMUNITY): Payer: Self-pay | Admitting: PULMONARY DISEASE

## 2021-08-01 ENCOUNTER — Ambulatory Visit (INDEPENDENT_AMBULATORY_CARE_PROVIDER_SITE_OTHER): Payer: Self-pay

## 2021-08-05 ENCOUNTER — Other Ambulatory Visit (HOSPITAL_COMMUNITY): Payer: Self-pay | Admitting: PULMONARY DISEASE

## 2021-08-05 DIAGNOSIS — R0602 Shortness of breath: Secondary | ICD-10-CM

## 2021-08-08 ENCOUNTER — Ambulatory Visit (INDEPENDENT_AMBULATORY_CARE_PROVIDER_SITE_OTHER): Payer: Self-pay

## 2021-08-15 ENCOUNTER — Ambulatory Visit (INDEPENDENT_AMBULATORY_CARE_PROVIDER_SITE_OTHER): Payer: Self-pay

## 2021-08-15 ENCOUNTER — Ambulatory Visit (HOSPITAL_COMMUNITY): Payer: 59

## 2021-08-16 ENCOUNTER — Other Ambulatory Visit (INDEPENDENT_AMBULATORY_CARE_PROVIDER_SITE_OTHER): Payer: 59 | Admitting: OTOLARYNGOLOGY

## 2021-08-16 DIAGNOSIS — J3081 Allergic rhinitis due to animal (cat) (dog) hair and dander: Secondary | ICD-10-CM

## 2021-08-16 DIAGNOSIS — J3089 Other allergic rhinitis: Secondary | ICD-10-CM

## 2021-08-16 DIAGNOSIS — J301 Allergic rhinitis due to pollen: Secondary | ICD-10-CM

## 2021-08-16 NOTE — Nursing Note (Signed)
08/16/21 0800   ENT Immunotherapy Vial Preparation Flowsheet   Allergy Test Date 11/21/19   Managing Physician Thadd Apuzzo   Initials rs   A   Preparation Date 08/16/21   Expiration Date 11/29/21   Immunotherapy Status Concentrate/Maintenance   Diagnosis Allergic Rhinitis due to pollen (J30.1)   Kerry Williams   Initial Endpoint - Kerry Williams 3   Dilution of Antigen 1   Volume of Antigen 0.90ml   Kerry Williams   Initial Endpoint - Kerry Williams 3   Dilution of Antigen 1   Volume of Antigen 0.86ml   A   Concentrates A 0.4   10% Glycerine Diluent A 5.1   Total Volume A 5.5   B   Preparation Date 08/16/21   Expiration Date 11/29/21   Immunotherapy Status Increase   Diagnosis Allergic Rhinitis due to mold (J30.89);Allergic Rhinitis due to Kerry Williams (J30.81);Allergic Rhinitis due to dustmite (J30.89)   Kerry Williams   Initial Endpoint - Kerry Williams 5   Dilution of Antigen 1   Volume of Antigen 0.89ml   Kerry Williams   Initial Endpoint - Kerry Williams 3   Dilution of Antigen Concentrate   Volume of Antigen 0.10ml   Kerry Williams   Initial Endpoint - Kerry Williams 2   Dilution of Antigen Concentrate   Volume of Antigen 0.30ml   Kerry Williams   Initial Endpoint - Kerry Williams 2   Dilution of Antigen Concentrate   Volume of Antigen 0.6ml   Kerry Williams   Initial Endpoint - Kerry Williams 2   Dilution of Antigen Concentrate   Volume of Antigen 0.49ml   Kerry Williams   Initial Endpoint - Kerry Williams 2   Dilution of Antigen Concentrate   Volume of Antigen 0.60ml   Kerry Williams   Initial Endpoint - Kerry Pulicaris 2   Dilution of Antigen Concentrate   Volume of Antigen 0.43ml   Kerry Williams   Initial Endpoint - Kerry Williams 2   Dilution of Antigen Concentrate   Volume of Antigen 0.24ml   Kerry Williams   Initial Endpoint - Kerry Williams 2   Dilution of Antigen Concentrate   Volume of Antigen 0.12ml   Kerry Williams   Initial Endpoint - Kerry Williams 4   Dilution of Antigen Concentrate   Volume of Antigen 0.60ml   Kerry Williams   Initial Endpoint - D.  Farinae Dust Williams 5   Dilution of Antigen Concentrate   Volume of Antigen 0.66ml   Kerry Williams   Initial Endpoint - Kerry Williams 5   Dilution of Antigen Concentrate   Volume of Antigen 0.17ml   B   Concentrates B 2.4   Saline Diluent B 3.1   Total Volume B 5.5     .me

## 2021-08-21 ENCOUNTER — Other Ambulatory Visit: Payer: Self-pay

## 2021-08-22 ENCOUNTER — Inpatient Hospital Stay
Admission: RE | Admit: 2021-08-22 | Discharge: 2021-08-22 | Disposition: A | Discharge status: Home / Self Care | Payer: 59 | Source: Ambulatory Visit | Attending: PULMONARY DISEASE | Admitting: PULMONARY DISEASE

## 2021-08-22 ENCOUNTER — Ambulatory Visit (INDEPENDENT_AMBULATORY_CARE_PROVIDER_SITE_OTHER): Payer: Self-pay

## 2021-08-22 DIAGNOSIS — R0602 Shortness of breath: Secondary | ICD-10-CM | POA: Insufficient documentation

## 2021-08-22 DIAGNOSIS — R059 Cough, unspecified: Secondary | ICD-10-CM | POA: Insufficient documentation

## 2021-08-22 MED ORDER — ALBUTEROL SULFATE 2.5 MG/3 ML (0.083 %) SOLUTION FOR NEBULIZATION
2.5000 mg | INHALATION_SOLUTION | Freq: Once | RESPIRATORY_TRACT | Status: AC
Start: 2021-08-22 — End: 2021-08-22
  Administered 2021-08-22: 2.5 mg via RESPIRATORY_TRACT

## 2021-08-22 MED ORDER — METHACHOLINE CHLORIDE 100 MG SOLUTION FOR INHALATION
1.0000 | Freq: Once | RESPIRATORY_TRACT | Status: AC
Start: 2021-08-22 — End: 2021-08-22
  Administered 2021-08-22: 1 via RESPIRATORY_TRACT
  Filled 2021-08-22: qty 1

## 2021-08-23 ENCOUNTER — Other Ambulatory Visit: Payer: Self-pay

## 2021-08-23 NOTE — Procedures (Signed)
NAMETONJUA, ROSSETTI  HOSPITAL NUMBER:  [id number]  DATE OF SERVICE:  08/22/2021  DOB:  1955-06-05  SEX:  F      NAME OF PROCEDURE:  Methacholine challenge test.    DESCRIPTION OF PROCEDURE:  This is a methacholine challenge test with a purpose to verify whether the patient has an underlying asthmatic disorder.  The protocol consisted of a baseline spirometry with significant increscendo doses of methacholine.  Her baseline spirometry did reveal an FEV1 of 105% of predicted and FEV1/FVC ratio was 70%.  The patient's forced vital capacity was 118% of predicted.  After the fourth dose of the patient's methacholine, there was 22% deterioration in the patient's FEV1 with FEV1/FVC ratio of 62%.    IMPRESSION:  1.Positive methacholine challenge test at level 4.  This could in the right context be compatible and/or suggestive of an asthmatic disorder.        Renee Rival, MD, [facility]  Pulmonary Critical Care Sleep Medicine Consultant  [phone]          DD:  08/22/2021 17:35:09  DT:  08/23/2021 11:05:19 DW  D#:  [id number]

## 2021-08-29 ENCOUNTER — Ambulatory Visit (INDEPENDENT_AMBULATORY_CARE_PROVIDER_SITE_OTHER): Payer: 59

## 2021-08-29 ENCOUNTER — Other Ambulatory Visit: Payer: Self-pay

## 2021-08-29 DIAGNOSIS — J309 Allergic rhinitis, unspecified: Secondary | ICD-10-CM

## 2021-08-29 NOTE — Nursing Note (Signed)
08/29/21 1300   Vial A   Set 1   Skin test/Wheal Size 0.60ml, 21mm   Injection Laterality Right   Injection Site Upper Arm   Dose  0.05   Time Observed 30 Minutes   Injection Site Reaction Negative   Vial B   Set 2   Skin test/Wheal Size .12ml, 35mm   Injection Laterality Right   Injection Site Upper Arm   Dose  0.05   Time Observed 30 Minutes   Injection Site Reaction Negative     Merlinda Frederick, LPN  12/28/8848, 27:74    I have reviewed the above allergy vial safety test, give injection.  Elnora Morrison, FNP-BC

## 2021-09-03 ENCOUNTER — Encounter (INDEPENDENT_AMBULATORY_CARE_PROVIDER_SITE_OTHER): Payer: Self-pay | Admitting: OTOLARYNGOLOGY

## 2021-09-03 ENCOUNTER — Ambulatory Visit (INDEPENDENT_AMBULATORY_CARE_PROVIDER_SITE_OTHER): Payer: 59 | Admitting: OTOLARYNGOLOGY

## 2021-09-03 ENCOUNTER — Other Ambulatory Visit: Payer: Self-pay

## 2021-09-03 ENCOUNTER — Ambulatory Visit (INDEPENDENT_AMBULATORY_CARE_PROVIDER_SITE_OTHER): Payer: 59

## 2021-09-03 VITALS — Ht 61.0 in | Wt 140.0 lb

## 2021-09-03 DIAGNOSIS — K219 Gastro-esophageal reflux disease without esophagitis: Secondary | ICD-10-CM

## 2021-09-03 DIAGNOSIS — H9313 Tinnitus, bilateral: Secondary | ICD-10-CM

## 2021-09-03 DIAGNOSIS — G4700 Insomnia, unspecified: Secondary | ICD-10-CM | POA: Insufficient documentation

## 2021-09-03 DIAGNOSIS — J309 Allergic rhinitis, unspecified: Secondary | ICD-10-CM

## 2021-09-03 DIAGNOSIS — R109 Unspecified abdominal pain: Secondary | ICD-10-CM | POA: Insufficient documentation

## 2021-09-03 DIAGNOSIS — J3089 Other allergic rhinitis: Secondary | ICD-10-CM

## 2021-09-03 NOTE — Nursing Note (Signed)
09/03/21 1000   Vial A   Set 1   Injection Laterality Right   Injection Site Lower Arm   Dose  0.1   Time Observed 20 Minutes   Injection Site Reaction Negative   Vial B   Set 2   Injection Laterality Left   Injection Site Upper Arm   Dose  0.1   Time Observed 20 Minutes   Injection Site Reaction Negative     Gabriel Carina, LPN  [id number], [id number]

## 2021-09-03 NOTE — H&P (Signed)
ENT, [facility]  [address]  [place] [address]  Phone: [phone]  Fax: [phone]      Encounter Date: 09/03/2021    Patient ID: Kerry Williams  MRN: [id number]    DOB: Dec 16, 1955  Age: 66 y.o. female     Progress Note       Referring Provider:  Miguel Aschoff, DO    Reason for Visit:   Chief Complaint   Patient presents with   . Follow Up 6 Months     6 month rc on allergies. States having increased tinnitus for the past 3 weeks. States has an upcoming appointment with audiologist.        History of Present Illness:  Kerry Williams is a 66 y.o. female who is FU on AR.   She has had tinnitus for a few years, and has been louder the last 3 weeks. It is a ringing sound and constant. She is taking nasacort and pantoprazole. Tolerating allergy shots well without adverse reactions. Pt states she is scheduled for an audiogram next week.       Patient History:  Patient Active Problem List   Diagnosis   . Abdominal pain   . Insomnia, unspecified type     Current Outpatient Medications   Medication Sig   . albuterol sulfate (PROVENTIL) 2.5 mg /3 mL (0.083 %) Inhalation nebulizer solution 3 ml by nebulizer every 6 hours for 30 day(s)   . alendronate (FOSAMAX) 70 mg Oral Tablet 1 tab(s) orally once a week   . ALPRAZolam (XANAX) 1 mg Oral Tablet 1 tab(s) orally 3 times a day for 30 days   . amitriptyline (ELAVIL) 25 mg Oral Tablet amitriptyline 25 mg tablet   . atorvastatin (LIPITOR) 10 mg Oral Tablet 1 tab(s) orally once a day for 90 days   . budesonide-formoteroL (SYMBICORT) 160-4.5 mcg/actuation Inhalation oral inhaler Symbicort 160 mcg-4.5 mcg/actuation HFA aerosol inhaler   . cholestyramine-aspartame (PREVALITE) 4 gram Oral Powder in Packet Cholestyramine Light 4 gram powder for susp in a packet   . denosumab (PROLIA) 60 mg/mL Subcutaneous Syringe subQ injection Prolia 60 mg/mL subcutaneous syringe   . diazePAM (VALIUM) 10 mg Oral Tablet diazepam 10 mg tablet   . diclofenac sodium (VOLTAREN) 50 mg Oral  Tablet, Delayed Release (E.C.) diclofenac sodium 50 mg tablet,delayed release   . dicyclomine (BENTYL) 10 mg Oral Capsule dicyclomine 10 mg capsule   . diflunisaL (DOLOBID) 500 mg Oral Tablet diflunisal 500 mg tablet   . DULoxetine (CYMBALTA DR) 60 mg Oral Capsule, Delayed Release(E.C.) 1 cap(s) orally once a day for 90 days   . fluconazole (DIFLUCAN) 150 mg Oral Tablet TAKE 1 TABLET ONCE   . hydrOXYzine pamoate (VISTARIL) 25 mg Oral Capsule 1 cap(s) orally 4 times a day PRN nausea, dizziness, insomnia for 14 days   . ipratropium-albuterol 0.5 mg-3 mg(2.5 mg base)/3 mL Solution for Nebulization ipratropium 0.5 mg-albuterol 3 mg (2.5 mg base)/3 mL nebulization soln   . lansoprazole (PREVACID) 30 mg Oral Capsule, Delayed Release(E.C.) lansoprazole 30 mg capsule,delayed release   . lidocaine (LIDODERM) 5 % Adhesive Patch, Medicated lidocaine 5 % topical patch   . mometasone-formoterol (DULERA) 200-5 mcg/dose Inhalation HFA Aerosol Inhaler 2 puff(s) inhaled 2 times a day for 30 days   . pantoprazole (PROTONIX) 40 mg Oral Tablet, Delayed Release (E.C.) 1 tab(s) orally BID for 90 days   . sucralfate (CARAFATE) 1 gram Oral Tablet sucralfate 1 gram tablet   . topiramate (TOPAMAX) 50 mg Oral Tablet  topiramate 50 mg tablet   . TRELEGY ELLIPTA 100-62.5-25 mcg Inhalation Disk with Device       Allergies   Allergen Reactions   . Levofloxacin  Other Adverse Reaction (Add comment) and Nausea/ Vomiting     pain  Other reaction(s): SICK       Past Medical History:   Diagnosis Date   . Allergic rhinitis      History reviewed. No pertinent surgical history.  Family Medical History:    None         Social History     Tobacco Use   . Smoking status: Never   . Smokeless tobacco: Never       Review of Systems:  Review of Systems    Physical Exam:  Ht 1.549 m (5\' 1" )   Wt 63.5 kg (140 lb)   BMI 26.45 kg/m       Physical Exam  Constitutional:       Appearance: Normal appearance. She is well-developed, well-groomed and normal weight.    HENT:      Head: Normocephalic and atraumatic.      Right Ear: Hearing, tympanic membrane, ear canal and external ear normal.      Left Ear: Hearing, tympanic membrane, ear canal and external ear normal.      Nose: Septal deviation and mucosal edema present.      Right Turbinates: Enlarged.      Left Turbinates: Enlarged.      Mouth/Throat:      Lips: Pink.      Mouth: Mucous membranes are moist.      Pharynx: Oropharynx is clear. Uvula midline.   Eyes:      Extraocular Movements: Extraocular movements intact.   Neck:      Trachea: Phonation normal.   Pulmonary:      Effort: Pulmonary effort is normal.   Musculoskeletal:      Cervical back: Normal range of motion and neck supple.   Lymphadenopathy:      Cervical: No cervical adenopathy.   Skin:     General: Skin is warm.   Neurological:      Mental Status: She is alert and oriented to person, place, and time.      Cranial Nerves: Cranial nerves 2-12 are intact. No facial asymmetry.   Psychiatric:         Attention and Perception: Attention normal.         Mood and Affect: Mood normal.         Speech: Speech normal.         Behavior: Behavior normal. Behavior is cooperative.         Assessment:  ENCOUNTER DIAGNOSES     ICD-10-CM   1. Allergic rhinitis due to dust mite  J30.89   2. 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 Laryngopharyngeal reflux (LPR)  K21.9       Plan:  Medical records reviewed on 09/03/2021.  Discussed masking techniques. Pt scheduled for audiogram at [facility] next week. Cont immunotherapy.   Orders Placed This Encounter   . [facility] - NASAL ENDOSCOPY DIAGNOSTIC UNILATERAL OR BILATERAL (AMB ONLY)     Return in about 2 weeks (around 09/17/2021).    09/19/2021, PA-C  09/03/2021, 10:57   The advanced practice clinician's documentation was reviewed/amended in its entirety with the assessment and plan portion completely performed independently by me during this separate encounter.

## 2021-09-03 NOTE — Procedures (Signed)
ENT, [facility]  [address]  [place] [address]    Procedure Note    Name: Kerry Williams MRN:  [id number]   Date: 09/03/2021 Age: 66 y.o.       [address] - NASAL ENDOSCOPY DIAGNOSTIC UNILATERAL OR BILATERAL (AMB ONLY)  Performed by: Conchita Paris, DO  Authorized by: Conchita Paris, DO     Time Out:     Immediately before the procedure, a time out was called:  Yes    Patient verified:  Yes    Procedure Verified:  Yes    Site Verified:  Yes  Documentation:      Indications for procedure: Monitor chronic disease    Anesthesia: Oxymetazoline nasal spray    Description: Nasal endoscopy with rigid scope was performed with examination of the  septum, inferior, middle, and superior meatus, turbinates, sphenoethmoidal recess, and nasopharynx.     There were no polyps, pus, or granulation tissue noted.  ET orifices and nasopharynx were normal.     Findings: Allergic rhinitis    The patient tolerated the procedure well.              Conchita Paris, DO

## 2021-09-05 ENCOUNTER — Ambulatory Visit (INDEPENDENT_AMBULATORY_CARE_PROVIDER_SITE_OTHER): Payer: Self-pay

## 2021-09-12 ENCOUNTER — Other Ambulatory Visit: Payer: Self-pay

## 2021-09-12 ENCOUNTER — Ambulatory Visit (INDEPENDENT_AMBULATORY_CARE_PROVIDER_SITE_OTHER): Payer: 59

## 2021-09-12 DIAGNOSIS — J309 Allergic rhinitis, unspecified: Secondary | ICD-10-CM

## 2021-09-12 NOTE — Nursing Note (Signed)
09/12/21 1300   Vial A   Set 1   Injection Laterality Right   Injection Site Upper Arm   Dose  0.15   Time Observed 20 Minutes   Injection Site Reaction Negative   Vial B   Set 2   Injection Laterality Left   Injection Site Lower Arm   Dose  0.15   Time Observed 20 Minutes   Injection Site Reaction Negative     Sheray Grist, LPN  09/12/2021, 13:30

## 2021-09-12 NOTE — Nursing Note (Signed)
09/12/21 1300   Vial A   Set 1   Injection Laterality Right   Injection Site Upper Arm   Dose  0.15   Time Observed 20 Minutes   Injection Site Reaction Negative   Vial B   Set 2   Injection Laterality Left   Injection Site Lower Arm   Dose  0.15   Time Observed 20 Minutes   Injection Site Reaction Negative     Gabriel Carina, LPN  [id number], [id number]

## 2021-09-19 ENCOUNTER — Other Ambulatory Visit: Payer: Self-pay

## 2021-09-19 ENCOUNTER — Ambulatory Visit (INDEPENDENT_AMBULATORY_CARE_PROVIDER_SITE_OTHER): Payer: 59

## 2021-09-19 ENCOUNTER — Encounter (INDEPENDENT_AMBULATORY_CARE_PROVIDER_SITE_OTHER): Payer: Self-pay | Admitting: OTOLARYNGOLOGY

## 2021-09-19 ENCOUNTER — Ambulatory Visit (INDEPENDENT_AMBULATORY_CARE_PROVIDER_SITE_OTHER): Payer: 59 | Admitting: OTOLARYNGOLOGY

## 2021-09-19 VITALS — Ht 61.0 in | Wt 140.0 lb

## 2021-09-19 DIAGNOSIS — K219 Gastro-esophageal reflux disease without esophagitis: Secondary | ICD-10-CM

## 2021-09-19 DIAGNOSIS — J309 Allergic rhinitis, unspecified: Secondary | ICD-10-CM

## 2021-09-19 DIAGNOSIS — H9313 Tinnitus, bilateral: Secondary | ICD-10-CM

## 2021-09-19 NOTE — Nursing Note (Signed)
09/19/21 1300   Vial A   Set 1   Injection Laterality Left   Injection Site Upper Arm   Dose  0.2   Time Observed 20 Minutes   Injection Site Reaction Negative   Vial B   Set 2   Injection Laterality Left   Injection Site Lower Arm   Dose  0.02   Time Observed 20 Minutes   Injection Site Reaction Negative     Mazy Culton Carolynn Comment, LPN  8/58/8502, 13:39

## 2021-09-19 NOTE — H&P (Signed)
[facility] Medicine  ENT, [facility]    Progress Note    Name: Kerry Williams MRN:  [id number]   Date: 09/19/2021 Age: 66 y.o.          Follow Up      Subjective:   Chief Complaint:   Follow-up After Testing (Rc after audio)       History of Present Illness:  Kerry Williams is a 66 y.o. old female who presents to the clinic for follow-up after audiogram.  Her audiogram shows that has normal hearing and tinnitus matching at [facility].  Allergy symptoms are well controlled.    Review of Systems     Physical Exam:     Vitals:    09/19/21 1305   Weight: 63.5 kg (140 lb)   Height: 1.549 m (5\' 1" )   BMI: 26.51      ENT Physical Exam  Constitutional  Appearance: patient appears well-developed, well-nourished and well-groomed,  Communication/Voice: communication appropriate for developmental age; vocal quality normal;  Head and Face  Appearance: head appears normal, face appears normal and face appears atraumatic;  Palpation: facial palpation normal;  Salivary: glands normal;  Ear  Hearing: intact;  Auricles: right auricle normal; left auricle normal;  External Mastoids: right external mastoid normal; left external mastoid normal;  Ear Canals: right ear canal normal; left ear canal normal;  Tympanic Membranes: right tympanic membrane normal; left tympanic membrane normal;  Nose  External Nose: nares patent bilaterally; external nose normal;  Internal Nose: bilateral intranasal mucosa edematous; nasal septal deviation present; bilateral inferior turbinates with hypertrophy;  Oral Cavity/Oropharynx  Lips: normal;  Teeth: normal;  Gums: gingiva normal;  Tongue: normal;  Oral mucosa: normal;  Hard palate: normal;  Neck  Neck: neck normal; neck palpation normal;  Thyroid: thyroid normal;  Respiratory  Inspection: breathing unlabored; normal breathing rate;  Lymphatic  Palpation: lymph nodes normal;  Neurovestibular  Mental Status: alert and oriented;  Psychiatric: mood normal; affect is appropriate;  Cranial Nerves: cranial nerves intact;        Assessment and Plan:       ICD-10-CM    1. Allergic rhinitis, unspecified seasonality, unspecified trigger  J30.9       2. 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 Laryngopharyngeal reflux (LPR)  K21.9       Discussed masking techniques and anxiety management.  Audiogram reviewed. Continue immunotherapy.      Follow up:  Return in about 6 months (around 03/21/2022).    03/23/2022, DO

## 2021-09-26 ENCOUNTER — Ambulatory Visit (INDEPENDENT_AMBULATORY_CARE_PROVIDER_SITE_OTHER): Payer: 59

## 2021-09-26 ENCOUNTER — Other Ambulatory Visit: Payer: Self-pay

## 2021-09-26 DIAGNOSIS — J309 Allergic rhinitis, unspecified: Secondary | ICD-10-CM

## 2021-09-26 NOTE — Nursing Note (Signed)
09/26/21 1300   Vial A   Set 1   Injection Laterality Left   Injection Site Lower Arm   Dose  0.25   Time Observed 20 Minutes   Injection Site Reaction Negative   Vial B   Set 2   Injection Laterality Left   Injection Site Upper Arm   Dose  0.05   Time Observed 20 Minutes   Injection Site Reaction Negative

## 2021-10-03 ENCOUNTER — Other Ambulatory Visit: Payer: Self-pay

## 2021-10-03 ENCOUNTER — Ambulatory Visit (INDEPENDENT_AMBULATORY_CARE_PROVIDER_SITE_OTHER): Payer: 59

## 2021-10-03 DIAGNOSIS — J309 Allergic rhinitis, unspecified: Secondary | ICD-10-CM

## 2021-10-03 NOTE — Nursing Note (Signed)
10/03/21 1300   Vial A   Set 1   Injection Laterality Right   Injection Site Upper Arm   Dose  0.3   Time Observed 20 Minutes   Injection Site Reaction Negative   Vial B   Set 2   Injection Laterality Right   Injection Site Upper Arm   Dose  0.1   Time Observed 20 Minutes   Injection Site Reaction Negative     Merlinda Frederick, LPN  11/16/7626, 13:35

## 2021-10-10 ENCOUNTER — Other Ambulatory Visit: Payer: Self-pay

## 2021-10-10 ENCOUNTER — Ambulatory Visit (INDEPENDENT_AMBULATORY_CARE_PROVIDER_SITE_OTHER): Payer: 59

## 2021-10-10 DIAGNOSIS — J309 Allergic rhinitis, unspecified: Secondary | ICD-10-CM

## 2021-10-10 NOTE — Nursing Note (Signed)
10/10/21 1300   Vial A   Set 1   Injection Laterality Left   Injection Site Lower Arm   Dose  0.35   Time Observed 20 Minutes   Injection Site Reaction Negative   Vial B   Set 2   Injection Laterality Left   Injection Site Upper Arm   Dose  0.15   Time Observed 20 Minutes   Injection Site Reaction Negative     Merlinda Frederick, LPN

## 2021-10-17 ENCOUNTER — Other Ambulatory Visit: Payer: Self-pay

## 2021-10-17 ENCOUNTER — Ambulatory Visit (INDEPENDENT_AMBULATORY_CARE_PROVIDER_SITE_OTHER): Payer: 59

## 2021-10-17 DIAGNOSIS — J309 Allergic rhinitis, unspecified: Secondary | ICD-10-CM

## 2021-10-17 NOTE — Nursing Note (Signed)
10/17/21 1300   Vial A   Set 1   Injection Laterality Left   Injection Site Upper Arm   Dose  0.4   Time Observed 20 Minutes   Injection Site Reaction Negative   Vial B   Set 2   Injection Laterality Left   Injection Site Lower Arm   Dose  0.2   Time Observed 20 Minutes   Injection Site Reaction Negative     Gabriel Carina, LPN

## 2021-10-24 ENCOUNTER — Other Ambulatory Visit: Payer: Self-pay

## 2021-10-24 ENCOUNTER — Ambulatory Visit (INDEPENDENT_AMBULATORY_CARE_PROVIDER_SITE_OTHER): Payer: BLUE CROSS/BLUE SHIELD

## 2021-10-24 DIAGNOSIS — J309 Allergic rhinitis, unspecified: Secondary | ICD-10-CM

## 2021-10-24 NOTE — Nursing Note (Signed)
10/24/21 1300   Vial A   Set 1   Injection Laterality Left   Injection Site Lower Arm   Dose  0.45   Time Observed 20 Minutes   Injection Site Reaction Negative   Vial B   Set 2   Injection Laterality Left   Injection Site Upper Arm   Dose  0.25   Time Observed 20 Minutes   Injection Site Reaction Negative     Merlinda Frederick, LPN

## 2021-10-31 ENCOUNTER — Ambulatory Visit (INDEPENDENT_AMBULATORY_CARE_PROVIDER_SITE_OTHER): Payer: Self-pay

## 2021-11-07 ENCOUNTER — Ambulatory Visit (INDEPENDENT_AMBULATORY_CARE_PROVIDER_SITE_OTHER): Payer: BLUE CROSS/BLUE SHIELD

## 2021-11-07 ENCOUNTER — Other Ambulatory Visit: Payer: Self-pay

## 2021-11-07 DIAGNOSIS — J309 Allergic rhinitis, unspecified: Secondary | ICD-10-CM

## 2021-11-07 NOTE — Nursing Note (Signed)
11/07/21 1300   Vial A   Set 1   Injection Laterality Right   Injection Site Upper Arm   Dose  0.35   Time Observed 20 Minutes   Injection Site Reaction Negative   Vial B   Set 2   Injection Laterality Right   Injection Site Upper Arm   Dose  0.15   Time Observed 20 Minutes   Injection Site Reaction Negative     Merlinda Frederick, LPN

## 2021-11-14 ENCOUNTER — Other Ambulatory Visit: Payer: Self-pay

## 2021-11-14 ENCOUNTER — Ambulatory Visit (INDEPENDENT_AMBULATORY_CARE_PROVIDER_SITE_OTHER): Payer: BLUE CROSS/BLUE SHIELD

## 2021-11-14 DIAGNOSIS — J309 Allergic rhinitis, unspecified: Secondary | ICD-10-CM

## 2021-11-14 NOTE — Nursing Note (Signed)
11/14/21 1400   Vial A   Set 1   Injection Laterality Right   Injection Site Lower Arm   Dose  0.4   Time Observed 20 Minutes   Injection Site Reaction Negative   Vial B   Set 2   Injection Laterality Right   Injection Site Upper Arm   Dose  0.2   Time Observed 20 Minutes   Injection Site Reaction Negative     Merlinda Frederick, LPN

## 2021-11-18 ENCOUNTER — Other Ambulatory Visit (INDEPENDENT_AMBULATORY_CARE_PROVIDER_SITE_OTHER): Payer: BLUE CROSS/BLUE SHIELD | Admitting: OTOLARYNGOLOGY

## 2021-11-18 DIAGNOSIS — J3089 Other allergic rhinitis: Secondary | ICD-10-CM

## 2021-11-18 DIAGNOSIS — J301 Allergic rhinitis due to pollen: Secondary | ICD-10-CM

## 2021-11-18 NOTE — Nursing Note (Signed)
11/18/21 1000   ENT Immunotherapy Vial Preparation Flowsheet   Allergy Test Date 11/21/19   Managing Physician Trampus Mcquerry   Initials rs   A   Preparation Date 11/18/21   Expiration Date 03/01/22   Immunotherapy Status Concentrate/Maintenance   Diagnosis Allergic Rhinitis due to pollen (J30.1)   Timothy   Initial Endpoint - Timothy 3   Dilution of Antigen Concentrate   Volume of Antigen 0.55ml   Maple   Initial Endpoint - Maple 3   Dilution of Antigen Concentrate   Volume of Antigen 0.5ml   A   Concentrates A 0.4   10% Glycerine Diluent A 5.1   Total Volume A 5.5   B   Preparation Date 11/18/21   Expiration Date 03/01/22   Immunotherapy Status No escalation   Diagnosis Allergic Rhinitis due to cat (J30.81);Allergic Rhinitis due to mold (J30.89);Allergic Rhinitis due to dustmite (J30.89)   Cladosporium   Initial Endpoint - Cladosporium 5   Dilution of Antigen 1   Volume of Antigen 0.61ml   Epidermophyton   Initial Endpoint - Epidermophyton 3   Dilution of Antigen Concentrate   Volume of Antigen 0.42ml   Bipolaris Sorokiniana   Initial Endpoint - Bipolaris Sorokiniana 2   Dilution of Antigen Concentrate   Volume of Antigen 0.50ml   Penicillium   Initial Endpoint - Penicillium 2   Dilution of Antigen Concentrate   Volume of Antigen 0.60ml   Corn Smut   Initial Endpoint - Corn Smut 2   Dilution of Antigen Concentrate   Volume of Antigen 0.47ml   Aureobasidium Pullulans   Initial Endpoint - Aureobasidium Pullulans 2   Dilution of Antigen Concentrate   Volume of Antigen 0.74ml   Gibberella Pulicris   Initial Endpoint - Gibberella Pulicaris 2   Dilution of Antigen Concentrate   Volume of Antigen 0.64ml   Epicoccum   Initial Endpoint - Epicoccum 2   Dilution of Antigen Concentrate   Volume of Antigen 0.8ml   Mucor   Initial Endpoint - Mucor 2   Dilution of Antigen Concentrate   Volume of Antigen 0.25ml   Cat   Initial Endpoint - Cat 4   Dilution of Antigen Concentrate   Volume of Antigen 0.44ml   D. Farinae Dust Mite    Initial Endpoint - D. Farinae Dust Mite 5   Dilution of Antigen Concentrate   Volume of Antigen 0.56ml   D. Pteronyssinus   Initial Endpoint - D. Pteronyssinus 5   Dilution of Antigen Concentrate   Volume of Antigen 0.51ml   B   Concentrates B 2.4   Saline Diluent B 3.2   Total Volume B 5.5     Jasmine Awe, LPN

## 2021-11-21 ENCOUNTER — Other Ambulatory Visit: Payer: Self-pay

## 2021-11-21 ENCOUNTER — Ambulatory Visit (INDEPENDENT_AMBULATORY_CARE_PROVIDER_SITE_OTHER): Payer: BLUE CROSS/BLUE SHIELD

## 2021-11-21 DIAGNOSIS — J309 Allergic rhinitis, unspecified: Secondary | ICD-10-CM

## 2021-11-21 NOTE — Nursing Note (Signed)
11/21/21 1300   Vial A   Set 1   Injection Laterality Right   Injection Site Upper Arm   Dose  0.45   Time Observed 20 Minutes   Injection Site Reaction Negative   Vial B   Set 2   Injection Laterality Left   Injection Site Lower Arm   Dose  0.25   Time Observed 20 Minutes   Injection Site Reaction Negative     Merlinda Frederick, LPN

## 2021-11-28 ENCOUNTER — Ambulatory Visit (INDEPENDENT_AMBULATORY_CARE_PROVIDER_SITE_OTHER): Payer: BLUE CROSS/BLUE SHIELD

## 2021-11-28 ENCOUNTER — Other Ambulatory Visit: Payer: Self-pay

## 2021-11-28 DIAGNOSIS — J309 Allergic rhinitis, unspecified: Secondary | ICD-10-CM

## 2021-11-28 NOTE — Nursing Note (Signed)
11/28/21 1300   Vial A   Set 1   Skin test/Wheal Size 0.24ml/ 74mm   Injection Laterality Right   Injection Site Lower Arm   Dose  0.4   Time Observed 30 Minutes   Injection Site Reaction Negative   Vial B   Set 2   Skin test/Wheal Size 0.41ml/ 68mm   Injection Laterality Right   Injection Site Upper Arm   Dose  0.2   Time Observed 30 Minutes   Injection Site Reaction Negative     Merlinda Frederick, LPN  I have reviewed the above allergy vial safety test, give injection.  Elnora Morrison, FNP-BC

## 2021-12-05 ENCOUNTER — Other Ambulatory Visit: Payer: Self-pay

## 2021-12-05 ENCOUNTER — Ambulatory Visit (INDEPENDENT_AMBULATORY_CARE_PROVIDER_SITE_OTHER): Payer: BLUE CROSS/BLUE SHIELD

## 2021-12-05 DIAGNOSIS — J309 Allergic rhinitis, unspecified: Secondary | ICD-10-CM

## 2021-12-05 NOTE — Nursing Note (Signed)
12/05/21 1300   Vial A   Set 1   Injection Laterality Left   Injection Site Upper Arm   Dose  0.45   Time Observed 20 Minutes   Injection Site Reaction Negative   Vial B   Set 2   Injection Laterality Left   Injection Site Lower Arm   Dose  0.25   Time Observed 20 Minutes   Injection Site Reaction Negative     Dashauna Heymann Carolynn Comment, LPN

## 2021-12-19 ENCOUNTER — Other Ambulatory Visit: Payer: Self-pay

## 2021-12-19 ENCOUNTER — Ambulatory Visit (INDEPENDENT_AMBULATORY_CARE_PROVIDER_SITE_OTHER): Payer: BLUE CROSS/BLUE SHIELD

## 2021-12-19 DIAGNOSIS — J309 Allergic rhinitis, unspecified: Secondary | ICD-10-CM

## 2021-12-19 NOTE — Nursing Note (Signed)
12/19/21 1300   Vial A   Set 1   Injection Laterality Left   Injection Site Lower Arm   Dose  0.35   Time Observed 20 Minutes   Injection Site Reaction Negative   Vial B   Set 2   Injection Laterality Left   Injection Site Upper Arm   Dose  0.15   Time Observed 20 Minutes   Injection Site Reaction Negative     Merlinda Frederick, LPN

## 2021-12-26 ENCOUNTER — Ambulatory Visit (INDEPENDENT_AMBULATORY_CARE_PROVIDER_SITE_OTHER): Payer: BLUE CROSS/BLUE SHIELD

## 2021-12-26 ENCOUNTER — Other Ambulatory Visit: Payer: Self-pay

## 2021-12-26 DIAGNOSIS — J309 Allergic rhinitis, unspecified: Secondary | ICD-10-CM

## 2021-12-26 NOTE — Nursing Note (Signed)
12/26/21 1300   Vial A   Set 1   Injection Laterality Right   Injection Site Upper Arm   Dose  0.4   Time Observed 20 Minutes   Injection Site Reaction Negative   Vial B   Set 2   Injection Laterality Left   Injection Site Lower Arm   Dose  0.2   Time Observed 20 Minutes   Injection Site Reaction Negative     Merlinda Frederick, LPN

## 2022-01-02 ENCOUNTER — Ambulatory Visit (INDEPENDENT_AMBULATORY_CARE_PROVIDER_SITE_OTHER): Payer: Self-pay

## 2022-01-09 ENCOUNTER — Ambulatory Visit (INDEPENDENT_AMBULATORY_CARE_PROVIDER_SITE_OTHER): Payer: BLUE CROSS/BLUE SHIELD

## 2022-01-09 ENCOUNTER — Other Ambulatory Visit: Payer: Self-pay

## 2022-01-09 DIAGNOSIS — J309 Allergic rhinitis, unspecified: Secondary | ICD-10-CM

## 2022-01-09 NOTE — Nursing Note (Signed)
01/09/22 1300   Vial A   Set 1   Injection Laterality Left   Injection Site Lower Arm   Dose  0.3   Time Observed 20 Minutes   Injection Site Reaction Negative   Vial B   Set 2   Injection Laterality Left   Injection Site Upper Arm   Dose  0.1   Time Observed 20 Minutes   Injection Site Reaction Negative     Merlinda Frederick, LPN

## 2022-01-16 ENCOUNTER — Other Ambulatory Visit: Payer: Self-pay

## 2022-01-16 ENCOUNTER — Ambulatory Visit (INDEPENDENT_AMBULATORY_CARE_PROVIDER_SITE_OTHER): Payer: BLUE CROSS/BLUE SHIELD

## 2022-01-16 DIAGNOSIS — J309 Allergic rhinitis, unspecified: Secondary | ICD-10-CM

## 2022-01-16 NOTE — Nursing Note (Signed)
01/16/22 1500   Vial A   Set 1   Injection Laterality Left   Injection Site Upper Arm   Dose  0.35   Time Observed 20 Minutes   Injection Site Reaction Negative   Vial B   Set 2   Injection Laterality Left   Injection Site Upper Arm   Dose  0.15   Time Observed 20 Minutes   Injection Site Reaction Negative     Merlinda Frederick, LPN

## 2022-01-23 ENCOUNTER — Other Ambulatory Visit: Payer: Self-pay

## 2022-01-23 ENCOUNTER — Ambulatory Visit (INDEPENDENT_AMBULATORY_CARE_PROVIDER_SITE_OTHER): Payer: BLUE CROSS/BLUE SHIELD

## 2022-01-23 DIAGNOSIS — J309 Allergic rhinitis, unspecified: Secondary | ICD-10-CM

## 2022-01-23 NOTE — Nursing Note (Signed)
01/23/22 1300   Vial A   Set 1   Injection Laterality Left   Injection Site Lower Arm   Dose  0.4   Time Observed 20 Minutes   Injection Site Reaction Negative   Vial B   Set 2   Injection Laterality Left   Injection Site Upper Arm   Dose  0.2   Time Observed 20 Minutes   Injection Site Reaction Negative     Merlinda Frederick, LPN

## 2022-01-30 ENCOUNTER — Other Ambulatory Visit: Payer: Self-pay

## 2022-01-30 ENCOUNTER — Ambulatory Visit (INDEPENDENT_AMBULATORY_CARE_PROVIDER_SITE_OTHER): Payer: BLUE CROSS/BLUE SHIELD

## 2022-01-30 DIAGNOSIS — J309 Allergic rhinitis, unspecified: Secondary | ICD-10-CM

## 2022-01-30 NOTE — Nursing Note (Signed)
01/30/22 1300   Vial A   Set 1   Injection Laterality Left   Injection Site Upper Arm   Dose  0.45   Time Observed 20 Minutes   Injection Site Reaction Negative   Vial B   Set 2   Injection Laterality Left   Injection Site Lower Arm   Dose  0.25   Time Observed 20 Minutes   Injection Site Reaction Negative     Merlinda Frederick, LPN

## 2022-02-06 ENCOUNTER — Other Ambulatory Visit: Payer: Self-pay

## 2022-02-06 ENCOUNTER — Ambulatory Visit (INDEPENDENT_AMBULATORY_CARE_PROVIDER_SITE_OTHER): Payer: Self-pay

## 2022-02-06 ENCOUNTER — Ambulatory Visit (INDEPENDENT_AMBULATORY_CARE_PROVIDER_SITE_OTHER): Payer: BLUE CROSS/BLUE SHIELD

## 2022-02-06 DIAGNOSIS — J309 Allergic rhinitis, unspecified: Secondary | ICD-10-CM

## 2022-02-06 NOTE — Nursing Note (Signed)
02/06/22 1500   Vial A   Set 1   Injection Laterality Left   Injection Site Lower Arm   Dose  0.5   Time Observed 20 Minutes   Injection Site Reaction Negative   Vial B   Set 2   Injection Laterality Left   Injection Site Upper Arm   Dose  0.3   Time Observed 20 Minutes   Injection Site Reaction Negative     Merlinda Frederick, LPN

## 2022-02-13 ENCOUNTER — Other Ambulatory Visit: Payer: Self-pay

## 2022-02-13 ENCOUNTER — Ambulatory Visit (INDEPENDENT_AMBULATORY_CARE_PROVIDER_SITE_OTHER): Payer: BLUE CROSS/BLUE SHIELD

## 2022-02-13 DIAGNOSIS — J309 Allergic rhinitis, unspecified: Secondary | ICD-10-CM

## 2022-02-13 NOTE — Nursing Note (Signed)
02/13/22 1300   Vial A   Set 1   Injection Laterality Left   Injection Site Upper Arm   Dose  0.5   Time Observed 20 Minutes   Injection Site Reaction Negative   Vial B   Set 2   Injection Laterality Left   Injection Site Lower Arm   Dose  0.35   Time Observed 20 Minutes   Injection Site Reaction Negative     Gabriel Carina, LPN

## 2022-02-17 ENCOUNTER — Other Ambulatory Visit (INDEPENDENT_AMBULATORY_CARE_PROVIDER_SITE_OTHER): Payer: BLUE CROSS/BLUE SHIELD | Admitting: OTOLARYNGOLOGY

## 2022-02-17 DIAGNOSIS — J301 Allergic rhinitis due to pollen: Secondary | ICD-10-CM

## 2022-02-17 DIAGNOSIS — J3089 Other allergic rhinitis: Secondary | ICD-10-CM

## 2022-02-17 DIAGNOSIS — J3081 Allergic rhinitis due to animal (cat) (dog) hair and dander: Secondary | ICD-10-CM

## 2022-02-17 NOTE — Nursing Note (Signed)
02/17/22 1100   ENT Immunotherapy Vial Preparation Flowsheet   Allergy Test Date 11/21/19   Managing Physician Dannah Ryles   Initials rs   A   Preparation Date 02/17/22   Expiration Date 06/01/22   Immunotherapy Status Concentrate/Maintenance   Diagnosis Allergic Rhinitis due to pollen (J30.1)   Timothy   Initial Endpoint - Timothy 3   Dilution of Antigen Concentrate   Volume of Antigen 0.58ml   Maple   Initial Endpoint - Maple 3   Dilution of Antigen Concentrate   Volume of Antigen 0.56ml   A   Concentrates A 0.4   10% Glycerine Diluent A 5.1   Total Volume A 5.5   B   Preparation Date 02/17/22   Expiration Date 06/01/22   Immunotherapy Status No escalation   Diagnosis Allergic Rhinitis due to cat (J30.81);Allergic Rhinitis due to mold (J30.89);Allergic Rhinitis due to dustmite (J30.89)   Cladosporium   Initial Endpoint - Cladosporium 5   Dilution of Antigen 1   Volume of Antigen 0.69ml   Epidermophyton   Initial Endpoint - Epidermophyton 3   Dilution of Antigen Concentrate   Volume of Antigen 0.79ml   Bipolaris Sorokiniana   Initial Endpoint - Bipolaris Sorokiniana 2   Dilution of Antigen Concentrate   Volume of Antigen 0.106ml   Penicillium   Initial Endpoint - Penicillium 2   Dilution of Antigen Concentrate   Volume of Antigen 0.63ml   Corn Smut   Initial Endpoint - Corn Smut 2   Dilution of Antigen Concentrate   Volume of Antigen 0.51ml   Aureobasidium Pullulans   Initial Endpoint - Aureobasidium Pullulans 2   Dilution of Antigen Concentrate   Volume of Antigen 0.17BL   Gibberella Pulicris   Initial Endpoint - Gibberella Pulicaris 2   Dilution of Antigen Concentrate   Volume of Antigen 0.26ml   Epicoccum   Initial Endpoint - Epicoccum 2   Dilution of Antigen Concentrate   Volume of Antigen 0.46ml   Mucor   Initial Endpoint - Mucor 2   Dilution of Antigen Concentrate   Volume of Antigen 0.60ml   Cat   Initial Endpoint - Cat 4   Dilution of Antigen Concentrate   Volume of Antigen 0.57ml   D. Farinae Dust Mite    Initial Endpoint - D. Farinae Dust Mite 5   Dilution of Antigen Concentrate   Volume of Antigen 0.76ml   D. Pteronyssinus   Initial Endpoint - D. Pteronyssinus 5   Dilution of Antigen Concentrate   Volume of Antigen 0.60ml   B   Concentrates B 2.4   Saline Diluent B 3.1   Total Volume B 5.5     Levert Feinstein, LPN

## 2022-02-20 ENCOUNTER — Ambulatory Visit (INDEPENDENT_AMBULATORY_CARE_PROVIDER_SITE_OTHER): Payer: Self-pay

## 2022-02-27 ENCOUNTER — Ambulatory Visit (INDEPENDENT_AMBULATORY_CARE_PROVIDER_SITE_OTHER): Payer: BLUE CROSS/BLUE SHIELD

## 2022-02-27 ENCOUNTER — Other Ambulatory Visit: Payer: Self-pay

## 2022-02-27 DIAGNOSIS — J309 Allergic rhinitis, unspecified: Secondary | ICD-10-CM

## 2022-02-27 NOTE — Nursing Note (Signed)
02/27/22 1300   Vial A   Set 1   Injection Laterality Left   Injection Site Lower Arm   Dose  0.5   Time Observed 20 Minutes   Injection Site Reaction Negative   Vial B   Set 2   Injection Laterality Left   Injection Site Upper Arm   Dose  0.4   Time Observed 20 Minutes   Injection Site Reaction Negative     Gabriel Carina, LPN

## 2022-03-06 ENCOUNTER — Ambulatory Visit (INDEPENDENT_AMBULATORY_CARE_PROVIDER_SITE_OTHER): Payer: Self-pay

## 2022-03-13 ENCOUNTER — Ambulatory Visit (INDEPENDENT_AMBULATORY_CARE_PROVIDER_SITE_OTHER): Payer: BLUE CROSS/BLUE SHIELD

## 2022-03-13 ENCOUNTER — Other Ambulatory Visit: Payer: Self-pay

## 2022-03-13 DIAGNOSIS — J309 Allergic rhinitis, unspecified: Secondary | ICD-10-CM

## 2022-03-13 NOTE — Nursing Note (Signed)
03/13/22 1300   Vial A   Set 1   Skin test/Wheal Size .51ml,7mm   Injection Laterality Left   Injection Site Upper Arm   Dose  0.35   Time Observed 30 Minutes   Injection Site Reaction Negative   Vial B   Set 0   Skin test/Wheal Size .35ml,8mm   Injection Laterality Left   Injection Site Lower Arm   Dose  0.25   Time Observed 30 Minutes   Injection Site Reaction Negative     April Manson Allan, LPN  I have reviewed the above allergy vial safety test, give injection.  Emiliano Dyer, FNP-BC

## 2022-03-20 ENCOUNTER — Ambulatory Visit (INDEPENDENT_AMBULATORY_CARE_PROVIDER_SITE_OTHER): Payer: BLUE CROSS/BLUE SHIELD

## 2022-03-20 ENCOUNTER — Other Ambulatory Visit: Payer: Self-pay

## 2022-03-20 DIAGNOSIS — J309 Allergic rhinitis, unspecified: Secondary | ICD-10-CM

## 2022-03-20 NOTE — Nursing Note (Signed)
03/20/22 1300   Vial A   Set 1   Injection Laterality Left   Injection Site Lower Arm   Dose  0.3  (Pt. had a delayed reation of redness and a whelt after last inj, dose decreased)   Time Observed 20 Minutes   Injection Site Reaction Negative   Vial B   Set 2   Injection Laterality Left   Injection Site Upper Arm   Dose  0.2  (Pt. had a delayed reation of redness and a whelt after last inj, dose decreased)   Time Observed 20 Minutes   Injection Site Reaction Negative     Gabriel Carina, LPN

## 2022-03-27 ENCOUNTER — Other Ambulatory Visit: Payer: Self-pay

## 2022-03-27 ENCOUNTER — Ambulatory Visit (INDEPENDENT_AMBULATORY_CARE_PROVIDER_SITE_OTHER): Payer: BLUE CROSS/BLUE SHIELD

## 2022-03-27 DIAGNOSIS — J309 Allergic rhinitis, unspecified: Secondary | ICD-10-CM

## 2022-03-27 NOTE — Nursing Note (Signed)
03/27/22 1300   Vial A   Set 1   Injection Laterality Left   Injection Site Upper Arm   Dose  0.35   Time Observed 20 Minutes   Injection Site Reaction Negative   Vial B   Set 2   Injection Laterality Left   Injection Site Lower Arm   Dose  0.25   Time Observed 20 Minutes   Injection Site Reaction Negative     Gabriel Carina, LPN

## 2022-04-03 ENCOUNTER — Ambulatory Visit (INDEPENDENT_AMBULATORY_CARE_PROVIDER_SITE_OTHER): Payer: BLUE CROSS/BLUE SHIELD

## 2022-04-03 ENCOUNTER — Ambulatory Visit (INDEPENDENT_AMBULATORY_CARE_PROVIDER_SITE_OTHER): Payer: BLUE CROSS/BLUE SHIELD | Admitting: OTOLARYNGOLOGY

## 2022-04-03 ENCOUNTER — Other Ambulatory Visit: Payer: Self-pay

## 2022-04-03 ENCOUNTER — Encounter (INDEPENDENT_AMBULATORY_CARE_PROVIDER_SITE_OTHER): Payer: Self-pay | Admitting: OTOLARYNGOLOGY

## 2022-04-03 VITALS — Ht 61.0 in | Wt 140.0 lb

## 2022-04-03 DIAGNOSIS — B070 Plantar wart: Secondary | ICD-10-CM | POA: Insufficient documentation

## 2022-04-03 DIAGNOSIS — K219 Gastro-esophageal reflux disease without esophagitis: Secondary | ICD-10-CM

## 2022-04-03 DIAGNOSIS — H9313 Tinnitus, bilateral: Secondary | ICD-10-CM

## 2022-04-03 DIAGNOSIS — J309 Allergic rhinitis, unspecified: Secondary | ICD-10-CM

## 2022-04-03 MED ORDER — MONTELUKAST 10 MG TABLET
10.0000 mg | ORAL_TABLET | Freq: Every day | ORAL | 3 refills | Status: AC
Start: 2022-04-03 — End: ?

## 2022-04-03 NOTE — H&P (Signed)
[facility], [facility]  [place] [address]  Phone: [phone]  Fax: [phone]      Encounter Date: 04/03/2022    Patient ID: Kerry Williams  MRN: [id number]    DOB: [birth year]  Age: 66 y.o. female     Progress Note       Referring Provider:  No ref. provider found    Reason for Visit:   Chief Complaint   Patient presents with    Allergies     6 mo rc, no complaints        History of Present Illness:  Kerry Williams is a 66 y.o. female who is FU on AR, LPR and tinnitus.   The constant tinnitus is unchanged. Tries all kinds of white noise devices with some relief. Tolerating allergy shots well without adverse reactions. Rarely takes Zyrtec now. No longer taking Protonix and denies heartburn, dysphagia, throat pain.     Patient History:  Patient Active Problem List   Diagnosis    Abdominal pain    Insomnia, unspecified type    Plantar wart     Current Outpatient Medications   Medication Sig    albuterol sulfate (PROVENTIL) 2.5 mg /3 mL (0.083 %) Inhalation nebulizer solution 3 ml by nebulizer every 6 hours for 30 day(s)    alendronate (FOSAMAX) 70 mg Oral Tablet 1 tab(s) orally once a week    ALPRAZolam (XANAX) 1 mg Oral Tablet 1 tab(s) orally 3 times a day for 30 days    amitriptyline (ELAVIL) 25 mg Oral Tablet amitriptyline 25 mg tablet    cholestyramine-aspartame (PREVALITE) 4 gram Oral Powder in Packet Cholestyramine Light 4 gram powder for susp in a packet    diflunisaL (DOLOBID) 500 mg Oral Tablet diflunisal 500 mg tablet    DULoxetine (CYMBALTA DR) 60 mg Oral Capsule, Delayed Release(E.C.) 1 cap(s) orally once a day for 90 days    ipratropium-albuterol 0.5 mg-3 mg(2.5 mg base)/3 mL Solution for Nebulization ipratropium 0.5 mg-albuterol 3 mg (2.5 mg base)/3 mL nebulization soln    montelukast (SINGULAIR) 10 mg Oral Tablet Take 1 Tablet (10 mg total) by mouth Once a day    pantoprazole (PROTONIX) 40 mg Oral Tablet, Delayed Release (E.C.) 1 tab(s) orally BID for 90 days    TRELEGY ELLIPTA  100-62.5-25 mcg Inhalation Disk with Device       Allergies   Allergen Reactions    Levofloxacin  Other Adverse Reaction (Add comment) and Nausea/ Vomiting     pain  Other reaction(s): SICK       Past Medical History:   Diagnosis Date    Allergic rhinitis      History reviewed. No pertinent surgical history.  Family Medical History:    None         Social History     Tobacco Use    Smoking status: Never    Smokeless tobacco: Never       Review of Systems:  Review of Systems    Physical Exam:  Ht 1.549 m (5\' 1" )   Wt 63.5 kg (140 lb)   BMI 26.45 kg/m       Physical Exam  Constitutional:       Appearance: Normal appearance. She is well-developed, well-groomed and normal weight.   HENT:      Head: Normocephalic and atraumatic.      Right Ear: Hearing, tympanic membrane, ear canal and external ear normal.      Left Ear: Hearing, tympanic membrane, ear canal  and external ear normal.      Nose: Septal deviation and mucosal edema present.      Right Turbinates: Enlarged.      Left Turbinates: Enlarged.      Mouth/Throat:      Lips: Pink.      Mouth: Mucous membranes are moist.      Pharynx: Oropharynx is clear. Uvula midline.   Eyes:      Extraocular Movements: Extraocular movements intact.   Neck:      Trachea: Phonation normal.   Pulmonary:      Effort: Pulmonary effort is normal.   Musculoskeletal:      Cervical back: Normal range of motion and neck supple.   Lymphadenopathy:      Cervical: No cervical adenopathy.   Skin:     General: Skin is warm.   Neurological:      Mental Status: She is alert and oriented to person, place, and time.      Cranial Nerves: Cranial nerves 2-12 are intact. No facial asymmetry.   Psychiatric:         Attention and Perception: Attention normal.         Mood and Affect: Mood normal.         Speech: Speech normal.         Behavior: Behavior normal. Behavior is cooperative.         Assessment:  ENCOUNTER DIAGNOSES     ICD-10-CM   1. Chronic allergic rhinitis  J30.9   2. 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 Laryngopharyngeal reflux (LPR)  K21.9       Plan:  Medical records reviewed on 04/03/2022.  Cont masking techniques. Rx Singulair. Cont IT. Pt wishes to stay off Protonix.   Orders Placed This Encounter    montelukast (SINGULAIR) 10 mg Oral Tablet     Return in about 6 months (around 10/02/2022).    Marcelline Deist, PA-C  04/03/2022, 13:18   The advanced practice clinician's documentation was reviewed/amended in its entirety with the assessment and plan portion completely performed independently by me during this separate encounter.

## 2022-04-03 NOTE — Nursing Note (Signed)
04/03/22 1300   Vial A   Set 1   Injection Laterality Left   Injection Site Lower Arm   Dose  0.4   Time Observed 20 Minutes   Injection Site Reaction Negative   Vial B   Set 2   Injection Laterality Left   Injection Site Upper Arm   Dose  0.3   Time Observed 20 Minutes   Injection Site Reaction Negative     Gabriel Carina, LPN

## 2022-04-03 NOTE — Procedures (Signed)
ENT, [facility]  [address]  [name] [address]    Procedure Note    Name: Kerry Williams MRN:  [id number]   Date: 04/03/2022 Age: 66 y.o.  DOB:   February 18, 1956       31231 - NASAL ENDOSCOPY DIAGNOSTIC UNILATERAL OR BILATERAL (AMB ONLY)    Performed by: Conchita Paris, DO  Authorized by: Conchita Paris, DO    Time Out:     Immediately before the procedure, a time out was called:  Yes    Patient verified:  Yes    Procedure Verified:  Yes    Site Verified:  Yes  Documentation:      Indications for procedure: Monitor chronic disease    Anesthesia: Oxymetazoline nasal spray    Description: Nasal endoscopy with rigid scope was performed with examination of the  septum, inferior, middle, and superior meatus, turbinates, sphenoethmoidal recess, and nasopharynx.     There were no polyps, pus, or granulation tissue noted.  ET orifices and nasopharynx were normal.     Findings: Septal deviation, allergic changes    The patient tolerated the procedure well.               Conchita Paris, DO

## 2022-04-10 ENCOUNTER — Other Ambulatory Visit: Payer: Self-pay

## 2022-04-10 ENCOUNTER — Ambulatory Visit (INDEPENDENT_AMBULATORY_CARE_PROVIDER_SITE_OTHER): Payer: BLUE CROSS/BLUE SHIELD

## 2022-04-10 DIAGNOSIS — J309 Allergic rhinitis, unspecified: Secondary | ICD-10-CM

## 2022-04-10 NOTE — Nursing Note (Signed)
04/10/22 1300   Vial A   Set 1   Injection Laterality Right   Injection Site Upper Arm   Dose  0.45   Time Observed 20 Minutes   Injection Site Reaction Negative   Vial B   Set 2   Injection Laterality Right   Injection Site Upper Arm   Dose  0.35   Time Observed 20 Minutes   Injection Site Reaction Negative     Gabriel Carina, LPN

## 2022-04-14 ENCOUNTER — Ambulatory Visit (INDEPENDENT_AMBULATORY_CARE_PROVIDER_SITE_OTHER): Payer: Self-pay

## 2022-04-24 ENCOUNTER — Ambulatory Visit (INDEPENDENT_AMBULATORY_CARE_PROVIDER_SITE_OTHER): Payer: BLUE CROSS/BLUE SHIELD

## 2022-04-24 ENCOUNTER — Other Ambulatory Visit: Payer: Self-pay

## 2022-04-24 DIAGNOSIS — J309 Allergic rhinitis, unspecified: Secondary | ICD-10-CM

## 2022-04-24 NOTE — Nursing Note (Signed)
04/24/22 1300   Vial A   Set 1   Injection Laterality Left   Injection Site Lower Arm   Dose  0.35   Time Observed 20 Minutes   Injection Site Reaction Negative   Vial B   Set 2   Injection Laterality Left   Injection Site Upper Arm   Dose  0.25   Time Observed 20 Minutes   Injection Site Reaction Negative     Gabriel Carina, LPN

## 2022-05-01 ENCOUNTER — Other Ambulatory Visit: Payer: Self-pay

## 2022-05-01 ENCOUNTER — Ambulatory Visit (INDEPENDENT_AMBULATORY_CARE_PROVIDER_SITE_OTHER): Payer: BLUE CROSS/BLUE SHIELD

## 2022-05-01 DIAGNOSIS — J309 Allergic rhinitis, unspecified: Secondary | ICD-10-CM

## 2022-05-01 NOTE — Nursing Note (Signed)
05/01/22 1300   Vial A   Set 1   Injection Laterality Left   Injection Site Upper Arm   Dose  0.4   Time Observed 20 Minutes   Injection Site Reaction Negative   Vial B   Set 2   Injection Laterality Left   Injection Site Lower Arm   Dose  0.3   Time Observed 20 Minutes   Injection Site Reaction Negative     Gabriel Carina, LPN

## 2022-05-08 ENCOUNTER — Ambulatory Visit (INDEPENDENT_AMBULATORY_CARE_PROVIDER_SITE_OTHER): Payer: BLUE CROSS/BLUE SHIELD

## 2022-05-08 ENCOUNTER — Other Ambulatory Visit: Payer: Self-pay

## 2022-05-08 DIAGNOSIS — J309 Allergic rhinitis, unspecified: Secondary | ICD-10-CM

## 2022-05-08 NOTE — Nursing Note (Signed)
05/08/22 1400   Vial A   Set 1   Injection Laterality Left   Injection Site Lower Arm   Dose  0.45   Time Observed 20 Minutes   Injection Site Reaction Negative   Vial B   Set 2   Injection Laterality Left   Injection Site Upper Arm   Dose  0.35   Time Observed 20 Minutes   Injection Site Reaction Negative     Gabriel Carina, LPN

## 2022-05-08 NOTE — Nursing Note (Signed)
05/08/22 1400   Vial A   Set 1   Injection Laterality Left   Injection Site Lower Arm   Dose  0.45   Time Observed 20 Minutes   Injection Site Reaction Barely Pink   Vial B   Set 2   Injection Laterality Left   Injection Site Upper Arm   Dose  0.35   Time Observed 20 Minutes   Injection Site Reaction Barely Pink     Gabriel Carina, LPN

## 2022-05-15 ENCOUNTER — Other Ambulatory Visit: Payer: Self-pay

## 2022-05-15 ENCOUNTER — Ambulatory Visit (INDEPENDENT_AMBULATORY_CARE_PROVIDER_SITE_OTHER): Payer: BLUE CROSS/BLUE SHIELD

## 2022-05-15 DIAGNOSIS — J309 Allergic rhinitis, unspecified: Secondary | ICD-10-CM

## 2022-05-15 NOTE — Nursing Note (Signed)
05/15/22 1200   Vial A   Set 1   Injection Laterality Left   Injection Site Upper Arm   Dose  0.45   Time Observed 20 Minutes   Injection Site Reaction Negative   Vial B   Set 2   Injection Laterality Left   Injection Site Lower Arm   Dose  35   Time Observed 20 Minutes   Injection Site Reaction Barely Pink     Sang Blount Manson Allan, LPN

## 2022-05-21 ENCOUNTER — Other Ambulatory Visit (INDEPENDENT_AMBULATORY_CARE_PROVIDER_SITE_OTHER): Payer: BLUE CROSS/BLUE SHIELD | Admitting: OTOLARYNGOLOGY

## 2022-05-21 DIAGNOSIS — J3089 Other allergic rhinitis: Secondary | ICD-10-CM

## 2022-05-21 DIAGNOSIS — J301 Allergic rhinitis due to pollen: Secondary | ICD-10-CM

## 2022-05-21 NOTE — Nursing Note (Signed)
05/21/22 1200   ENT Immunotherapy Vial Preparation Flowsheet   Allergy Test Date 11/21/19   Managing Physician Madelin Weseman   Initials rs   A   Preparation Date 05/21/22   Expiration Date 08/31/22   Immunotherapy Status Concentrate/Maintenance   Diagnosis Allergic Rhinitis due to pollen (J30.1)   Timothy   Initial Endpoint - Timothy 3   Dilution of Antigen Concentrate   Volume of Antigen 0.19ml   Maple   Initial Endpoint - Maple 3   Dilution of Antigen Concentrate   Volume of Antigen 0.65ml   A   Concentrates A 0.4   10% Glycerine Diluent A 5.1   Total Volume A 5.5   B   Preparation Date 05/21/22   Expiration Date 08/31/22   Immunotherapy Status No escalation   Diagnosis Allergic Rhinitis due to cat (J30.81);Allergic Rhinitis due to dustmite (J30.89);Allergic Rhinitis due to mold (J30.89)   Cladosporium   Initial Endpoint - Cladosporium 5   Dilution of Antigen 1   Volume of Antigen 0.11ml   Epidermophyton   Initial Endpoint - Epidermophyton 3   Dilution of Antigen Concentrate   Volume of Antigen 0.25ml   Bipolaris Sorokiniana   Initial Endpoint - Bipolaris Sorokiniana 2   Dilution of Antigen Concentrate   Volume of Antigen 0.72ml   Penicillium   Initial Endpoint - Penicillium 2   Dilution of Antigen Concentrate   Volume of Antigen 0.60ml   Corn Smut   Initial Endpoint - Corn Smut 2   Dilution of Antigen Concentrate   Volume of Antigen 0.12ml   Aureobasidium Pullulans   Initial Endpoint - Aureobasidium Pullulans 2   Dilution of Antigen Concentrate   Volume of Antigen 0.22ml   Gibberella Pulicris   Initial Endpoint - Gibberella Pulicaris 2   Dilution of Antigen Concentrate   Volume of Antigen 0.61ml   Epicoccum   Initial Endpoint - Epicoccum 2   Dilution of Antigen Concentrate   Volume of Antigen 0.72ml   Mucor   Initial Endpoint - Mucor 2   Dilution of Antigen Concentrate   Volume of Antigen 0.59ml   Cat   Initial Endpoint - Cat 4   Dilution of Antigen Concentrate   Volume of Antigen 0.27ml   D. Farinae Dust Mite    Initial Endpoint - D. Farinae Dust Mite 5   Dilution of Antigen Concentrate   Volume of Antigen 0.66ml   D. Pteronyssinus   Initial Endpoint - D. Pteronyssinus 5   Dilution of Antigen Concentrate   Volume of Antigen 0.20ml   B   Concentrates B 2.4   Saline Diluent B 3.1   Total Volume B 5.5     Jasmine Awe, LPN

## 2022-05-22 ENCOUNTER — Ambulatory Visit (INDEPENDENT_AMBULATORY_CARE_PROVIDER_SITE_OTHER): Payer: BLUE CROSS/BLUE SHIELD

## 2022-05-29 ENCOUNTER — Ambulatory Visit (INDEPENDENT_AMBULATORY_CARE_PROVIDER_SITE_OTHER): Payer: BLUE CROSS/BLUE SHIELD

## 2022-06-05 ENCOUNTER — Other Ambulatory Visit: Payer: Self-pay

## 2022-06-05 ENCOUNTER — Ambulatory Visit (INDEPENDENT_AMBULATORY_CARE_PROVIDER_SITE_OTHER): Payer: BLUE CROSS/BLUE SHIELD

## 2022-06-05 DIAGNOSIS — J309 Allergic rhinitis, unspecified: Secondary | ICD-10-CM

## 2022-06-05 NOTE — Nursing Note (Signed)
06/05/22 1300   Vial A   Set 1   Skin test/Wheal Size 0.11ml/ 4mm   Injection Laterality Left   Injection Site Lower Arm   Dose  0.25   Time Observed 30 Minutes   Injection Site Reaction Negative   Vial B   Set 2   Skin test/Wheal Size 0.81ml/ 74mm   Injection Laterality Left   Injection Site Upper Arm   Dose  0.2   Time Observed 30 Minutes   Injection Site Reaction Negative     Gabriel Carina, LPN

## 2022-06-05 NOTE — Nursing Note (Signed)
06/05/22 1300   Vial A   Set 1   Skin test/Wheal Size 0.02ml/ 7mm   Injection Laterality Left   Injection Site Lower Arm   Dose  0.25   Time Observed 30 Minutes   Injection Site Reaction Negative   Vial B   Set 2   Skin test/Wheal Size 0.02ml/ 8mm   Injection Laterality Left   Injection Site Upper Arm   Dose  0.2   Time Observed 30 Minutes   Injection Site Reaction Negative     Jacqueline Lyle, LPN

## 2022-06-12 ENCOUNTER — Other Ambulatory Visit: Payer: Self-pay

## 2022-06-12 ENCOUNTER — Ambulatory Visit (INDEPENDENT_AMBULATORY_CARE_PROVIDER_SITE_OTHER): Payer: BLUE CROSS/BLUE SHIELD

## 2022-06-12 DIAGNOSIS — J309 Allergic rhinitis, unspecified: Secondary | ICD-10-CM

## 2022-06-12 NOTE — Nursing Note (Signed)
06/12/22 1300   Vial A   Set 1   Injection Laterality Left   Injection Site Upper Arm   Dose  0.3   Time Observed 20 Minutes   Injection Site Reaction Negative   Vial B   Set 2   Injection Laterality Right   Injection Site Lower Arm   Dose  0.25   Time Observed 20 Minutes   Injection Site Reaction Negative     Gabriel Carina, LPN

## 2022-06-19 ENCOUNTER — Other Ambulatory Visit: Payer: Self-pay

## 2022-06-19 ENCOUNTER — Ambulatory Visit (INDEPENDENT_AMBULATORY_CARE_PROVIDER_SITE_OTHER): Payer: BLUE CROSS/BLUE SHIELD

## 2022-06-19 DIAGNOSIS — J309 Allergic rhinitis, unspecified: Secondary | ICD-10-CM

## 2022-06-19 NOTE — Nursing Note (Signed)
06/19/22 1300   Vial A   Set 1   Injection Laterality Left   Injection Site Lower Arm   Dose  0.35   Time Observed 20 Minutes   Injection Site Reaction Negative   Vial B   Set 2   Injection Laterality Left   Injection Site Upper Arm   Dose  0.3   Time Observed 20 Minutes   Injection Site Reaction Negative     Gabriel Carina, LPN

## 2022-06-26 ENCOUNTER — Encounter (INDEPENDENT_AMBULATORY_CARE_PROVIDER_SITE_OTHER): Payer: Self-pay

## 2022-06-26 ENCOUNTER — Ambulatory Visit (INDEPENDENT_AMBULATORY_CARE_PROVIDER_SITE_OTHER): Payer: BLUE CROSS/BLUE SHIELD

## 2022-06-26 DIAGNOSIS — J309 Allergic rhinitis, unspecified: Secondary | ICD-10-CM

## 2022-06-26 NOTE — Nursing Note (Signed)
06/26/22 1300   Vial A   Set 1   Injection Laterality Left   Injection Site Upper Arm   Dose  0.4   Time Observed 20 Minutes   Injection Site Reaction Negative   Vial B   Set 2   Injection Laterality Left   Injection Site Lower Arm   Dose  0.35   Time Observed 20 Minutes   Injection Site Reaction Negative     Gabriel Carina, LPN

## 2022-07-03 ENCOUNTER — Ambulatory Visit (INDEPENDENT_AMBULATORY_CARE_PROVIDER_SITE_OTHER): Payer: Self-pay

## 2022-07-10 ENCOUNTER — Other Ambulatory Visit: Payer: Self-pay

## 2022-07-10 ENCOUNTER — Ambulatory Visit (INDEPENDENT_AMBULATORY_CARE_PROVIDER_SITE_OTHER): Payer: BLUE CROSS/BLUE SHIELD

## 2022-07-10 DIAGNOSIS — J309 Allergic rhinitis, unspecified: Secondary | ICD-10-CM

## 2022-07-10 NOTE — Nursing Note (Signed)
07/10/22 1300   Vial A   Set 1   Injection Laterality Left   Injection Site Lower Arm   Dose  0.3   Time Observed 20 Minutes   Injection Site Reaction Negative   Vial B   Set 2   Injection Laterality Left   Injection Site Upper Arm   Dose  0.25   Time Observed 20 Minutes   Injection Site Reaction Negative     Kalani Baray Manson Allan, LPN

## 2022-07-17 ENCOUNTER — Other Ambulatory Visit: Payer: Self-pay

## 2022-07-17 ENCOUNTER — Ambulatory Visit (INDEPENDENT_AMBULATORY_CARE_PROVIDER_SITE_OTHER): Payer: BLUE CROSS/BLUE SHIELD

## 2022-07-17 DIAGNOSIS — J309 Allergic rhinitis, unspecified: Secondary | ICD-10-CM

## 2022-07-17 NOTE — Nursing Note (Signed)
07/17/22 1300   Vial A   Set 1   Injection Laterality Right   Injection Site Upper Arm   Dose  0.35   Time Observed 20 Minutes   Injection Site Reaction Negative   Vial B   Set 2   Injection Laterality Right   Injection Site Upper Arm   Dose  0.3   Time Observed 20 Minutes   Injection Site Reaction Negative     Gabriel Carina, LPN

## 2022-07-24 ENCOUNTER — Other Ambulatory Visit: Payer: Self-pay

## 2022-07-24 ENCOUNTER — Ambulatory Visit (INDEPENDENT_AMBULATORY_CARE_PROVIDER_SITE_OTHER): Payer: BLUE CROSS/BLUE SHIELD

## 2022-07-24 DIAGNOSIS — J309 Allergic rhinitis, unspecified: Secondary | ICD-10-CM

## 2022-07-24 NOTE — Nursing Note (Signed)
07/24/22 1300   Vial A   Set 1   Injection Laterality Left   Injection Site Lower Arm   Dose  0.4   Time Observed 20 Minutes   Injection Site Reaction Negative   Vial B   Set 2   Injection Laterality Left   Injection Site Upper Arm   Dose  0.35   Time Observed 20 Minutes   Injection Site Reaction Negative     Gabriel Carina, LPN

## 2022-07-31 ENCOUNTER — Ambulatory Visit (INDEPENDENT_AMBULATORY_CARE_PROVIDER_SITE_OTHER): Payer: BLUE CROSS/BLUE SHIELD

## 2022-07-31 ENCOUNTER — Other Ambulatory Visit: Payer: Self-pay

## 2022-07-31 DIAGNOSIS — J309 Allergic rhinitis, unspecified: Secondary | ICD-10-CM

## 2022-07-31 NOTE — Nursing Note (Signed)
07/31/22 1300   Vial A   Set 1   Injection Laterality Left   Injection Site Upper Arm   Dose  0.45   Time Observed 20 Minutes   Injection Site Reaction Negative   Vial B   Set 2   Injection Laterality Left   Injection Site Lower Arm   Dose  0.4   Time Observed 20 Minutes   Injection Site Reaction Negative     Gabriel Carina, LPN

## 2022-08-07 ENCOUNTER — Ambulatory Visit (INDEPENDENT_AMBULATORY_CARE_PROVIDER_SITE_OTHER): Payer: BLUE CROSS/BLUE SHIELD

## 2022-08-07 ENCOUNTER — Other Ambulatory Visit: Payer: Self-pay

## 2022-08-07 DIAGNOSIS — J309 Allergic rhinitis, unspecified: Secondary | ICD-10-CM

## 2022-08-07 NOTE — Nursing Note (Signed)
08/07/22 1300   Vial A   Set 1   Injection Laterality Left   Injection Site Lower Arm   Dose  0.5   Time Observed 20 Minutes   Injection Site Reaction Negative   Vial B   Set 2   Injection Laterality Left   Injection Site Upper Arm   Dose  0.45   Time Observed 20 Minutes   Injection Site Reaction Negative     Gabriel Carina, LPN

## 2022-08-14 ENCOUNTER — Other Ambulatory Visit: Payer: Self-pay

## 2022-08-14 ENCOUNTER — Ambulatory Visit (INDEPENDENT_AMBULATORY_CARE_PROVIDER_SITE_OTHER): Payer: BLUE CROSS/BLUE SHIELD

## 2022-08-14 DIAGNOSIS — J309 Allergic rhinitis, unspecified: Secondary | ICD-10-CM

## 2022-08-14 NOTE — Nursing Note (Signed)
08/14/22 1300   Vial A   Set 1   Injection Laterality Left   Injection Site Upper Arm   Dose  0.5   Time Observed 20 Minutes   Injection Site Reaction Negative   Vial B   Set 2   Injection Laterality Left   Injection Site Lower Arm   Dose  0.5   Time Observed 20 Minutes   Injection Site Reaction Redness     Gabriel Carina, LPN

## 2022-08-21 ENCOUNTER — Other Ambulatory Visit: Payer: Self-pay

## 2022-08-21 ENCOUNTER — Ambulatory Visit (INDEPENDENT_AMBULATORY_CARE_PROVIDER_SITE_OTHER): Payer: BLUE CROSS/BLUE SHIELD

## 2022-08-21 DIAGNOSIS — J309 Allergic rhinitis, unspecified: Secondary | ICD-10-CM

## 2022-08-21 NOTE — Nursing Note (Signed)
08/21/22 1300   Vial A   Set 1   Injection Laterality Left   Injection Site Lower Arm   Dose  0.5   Time Observed 20 Minutes   Injection Site Reaction Negative   Vial B   Set 2   Injection Laterality Left   Injection Site Upper Arm   Dose  0.45   Time Observed 20 Minutes   Injection Site Reaction Negative     Gabriel Carina, LPN

## 2022-08-28 ENCOUNTER — Ambulatory Visit (INDEPENDENT_AMBULATORY_CARE_PROVIDER_SITE_OTHER): Payer: Self-pay

## 2022-09-30 ENCOUNTER — Encounter (INDEPENDENT_AMBULATORY_CARE_PROVIDER_SITE_OTHER): Payer: Self-pay | Admitting: OTOLARYNGOLOGY

## 2023-04-09 ENCOUNTER — Other Ambulatory Visit: Payer: Self-pay

## 2023-04-09 ENCOUNTER — Inpatient Hospital Stay (HOSPITAL_BASED_OUTPATIENT_CLINIC_OR_DEPARTMENT_OTHER)
Admission: RE | Admit: 2023-04-09 | Discharge: 2023-04-09 | Disposition: A | Discharge status: Still In Hospital | Payer: Medicare HMO | Source: Ambulatory Visit

## 2023-04-09 DIAGNOSIS — C4401 Basal cell carcinoma of skin of lip: Secondary | ICD-10-CM | POA: Insufficient documentation

## 2023-04-09 DIAGNOSIS — Z510 Encounter for antineoplastic radiation therapy: Secondary | ICD-10-CM | POA: Insufficient documentation

## 2023-04-09 DIAGNOSIS — C44719 Basal cell carcinoma of skin of left lower limb, including hip: Secondary | ICD-10-CM

## 2023-04-09 DIAGNOSIS — L539 Erythematous condition, unspecified: Secondary | ICD-10-CM | POA: Insufficient documentation

## 2023-04-09 DIAGNOSIS — R110 Nausea: Secondary | ICD-10-CM | POA: Insufficient documentation

## 2023-04-09 NOTE — Addendum Note (Signed)
Encounter addended by: Hedy Jacob, [facility] on: 04/09/2023 11:00 AM   Actions taken: Charge Capture section accepted

## 2023-04-09 NOTE — Progress Notes (Signed)
ELECTRON  SIMULATION     Name: Prisma Brom MRN:  [id number]   Date: 04/09/2023 Age: 67 y.o.     Pattijo Campo  was taken into the treatment suite and placed in the treatment position for the simulation and planning process.  The appropriate immobilization devices were employed in an effort to minimize the patient's movement during the setup and treatment procedure.    In an effort to maximize the accuracy of the patient's daily position, fiducial marks have been placed on the patient.  Laser positioning systems within the room and have been programmed to move to the alignment position which are specific to this patient.      Once the patient was properly positioned, the movement of the table range specific for the patient's treatment was tested to ensure the planned range was achievable.  The clearance of the patient's extremities as well as the immobilization devices were evaluated to ensure there would be no collision between the patient or the patient's devices and the gantry housing.      The movement of the lasers to the patient specific fiducial location was evaluated to ensure the proper patient setup parameters were achieved.     Finally, the positioning of the electron treatment field position was completed.  Electron cutout choice and field markings were completed and documented to allow creation of the patient specific treatment plan.    Molli Barrows, MD

## 2023-04-09 NOTE — Addendum Note (Signed)
Encounter addended by: Molli Barrows, MD on: 04/09/2023 10:54 AM   Actions taken: Clinical Note Signed

## 2023-04-09 NOTE — Progress Notes (Signed)
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      Recommendations: Definitive radiotherapy.  I have discussed the potential side effects given her timed have her questions answered.  She will undergo planning today and start her treatments next week.    The indications, time course, benefits, risks and side effects of radiation treatment were explained to the patient, and her questions were answered to her apparent satisfaction. I encouraged her to contact us at any time should she have any further questions or concerns. I personally saw and examined the patient, and reviewed all prior imaging and pathologic findings with her. I spent greater than 50% of a 45 minute visit in discussion of the patient's diagnosis and management.    FULL NOTE   Chief Complaint:  Basal cell carcinoma    History of Present Illness :   Kerry Williams is a 67 y.o. female with a history of multiple his skin cancers removed from the trunk.  She now has a left upper lip is cell carcinoma.  This has been biopsy-proven.  She has been recommended to consider radiation treatments versus surgery.    Prior Radiation:  None   Prior Chemotherapy:  None    Pain Assessment:  None    Implantable Cardiac Device:  None    Past Medical/Surgical History:  Past Medical History:   Diagnosis Date    Allergic rhinitis      Prior surgery for several skin cancers.    Family History:   Family Medical History:    None           Social History:   Social History     Socioeconomic History    Marital status: Married     Spouse name: Not on file    Number of children: Not on file    Years of education: Not on file    Highest education level: Not on file   Occupational History    Not on file   Tobacco Use    Smoking status: Never    Smokeless tobacco: Never    Substance and Sexual Activity    Alcohol use: Not on file    Drug use: Not on file    Sexual activity: Not on file   Other Topics Concern    Not on file   Social History Narrative    Not on file     Social Determinants of Health     Financial Resource Strain: Not on file   Transportation Needs: Not on file   Social Connections: Not on file   Intimate Partner Violence: Not on file   Housing Stability: Not on file       ALLERGIES:   Allergies   Allergen Reactions    Levofloxacin  Other Adverse Reaction (Add comment) and Nausea/ Vomiting     pain  Other reaction(s): SICK          MEDICATIONS:   Current Outpatient Medications   Medication Instructions    albuterol sulfate (PROVENTIL) 2.5 mg /3 mL (0.083 %) Inhalation nebulizer solution 3 ml by nebulizer every 6 hours for 30 day(s)    alendronate (FOSAMAX) 70 mg Oral Tablet 1 tab(s) orally once a week  ALPRAZolam (XANAX) 1 mg Oral Tablet 1 tab(s) orally 3 times a day for 30 days    amitriptyline (ELAVIL) 25 mg Oral Tablet amitriptyline 25 mg tablet    cholestyramine-aspartame (PREVALITE) 4 gram Oral Powder in Packet Cholestyramine Light 4 gram powder for susp in a packet    diflunisaL (DOLOBID) 500 mg Oral Tablet diflunisal 500 mg tablet    DULoxetine (CYMBALTA DR) 60 mg Oral Capsule, Delayed Release(E.C.) 1 cap(s) orally once a day for 90 days    ipratropium-albuterol 0.5 mg-3 mg(2.5 mg base)/3 mL Solution for Nebulization ipratropium 0.5 mg-albuterol 3 mg (2.5 mg base)/3 mL nebulization soln    montelukast (SINGULAIR) 10 mg, Oral, DAILY    pantoprazole (PROTONIX) 40 mg Oral Tablet, Delayed Release (E.C.) 1 tab(s) orally BID for 90 days    TRELEGY ELLIPTA 100-62.5-25 mcg Inhalation Disk with Device No dose, route, or frequency recorded.        INTERVAL SURGERIES:   No past surgical history on file.     REVIEW OF SYSTEMS  Pertinent review of systems as discussed in HPI.      Objective:     Vitals:    04/09/23 0958   BP: (!) 158/92   Pulse: 100   Resp: 18   Temp: 37.4  C (99.3 F)             PHYSICAL EXAMINATION  Physical Exam  Constitutional:       Appearance: Normal appearance.   HENT:      Head: Normocephalic.      Mouth/Throat:      Comments: 1 cm left upper lip basal cell carcinoma.  Eyes:      Extraocular Movements: Extraocular movements intact.      Pupils: Pupils are equal, round, and reactive to light.   Musculoskeletal:         General: Normal range of motion.      Cervical back: Normal range of motion and neck supple.   Neurological:      General: No focal deficit present.      Mental Status: She is alert and oriented to person, place, and time.   Psychiatric:         Mood and Affect: Mood normal.         Behavior: Behavior normal.          LABS/IMAGING: All relevant labs and imaging were reviewed as per HPI.      Molli Barrows, MD 04/09/2023, 10:29    WU:[id number]@

## 2023-04-13 ENCOUNTER — Inpatient Hospital Stay (HOSPITAL_BASED_OUTPATIENT_CLINIC_OR_DEPARTMENT_OTHER)
Admission: RE | Admit: 2023-04-13 | Discharge: 2023-04-13 | Disposition: A | Discharge status: Still In Hospital | Payer: Medicare HMO | Source: Ambulatory Visit

## 2023-04-13 ENCOUNTER — Other Ambulatory Visit: Payer: Self-pay

## 2023-04-13 DIAGNOSIS — C4401 Basal cell carcinoma of skin of lip: Secondary | ICD-10-CM

## 2023-04-13 NOTE — Progress Notes (Signed)
SIMULATION AND TREATMENT PLANNING      Name: Kerry Williams MRN:  [id number]   Date: 04/13/2023 Age: 67 y.o.       TREATMENT PLANNING:  Patient's clinical information including diagnostic imaging and pathology in addition to physical assessment were considered in generation of this plan.  The patient had previously undergone simulation and CT scan for treatment purposes.        SIMULATION: The patient's normal tissues, tumor and clinical targets were created.  Complex computer-assisted planning was utilized to generate the treatment plan.  The plan was optimized through careful modification of beam aperature, beam energy and all other treatment planning tools available.        PLAN REVIEW:  The graphic plan, dose statistics, and dose-volume histogram have been reviewed.  There will be adequate target coverage with satisfactory sparing of normal tissues.  The plan has been approved pending physics QA testing.        Molli Barrows, MD  04/13/2023 11:12     This note was partially generated using MModal Fluency Direct system, and there may be some incorrect words, spellings, and punctuation that were not noted in checking the note before saving.

## 2023-04-15 ENCOUNTER — Inpatient Hospital Stay (HOSPITAL_BASED_OUTPATIENT_CLINIC_OR_DEPARTMENT_OTHER)
Admission: RE | Admit: 2023-04-15 | Discharge: 2023-04-15 | Disposition: A | Discharge status: Still In Hospital | Payer: Medicare HMO | Source: Ambulatory Visit | Attending: RADIATION ONCOLOGY | Admitting: RADIATION ONCOLOGY

## 2023-04-15 ENCOUNTER — Other Ambulatory Visit: Payer: Self-pay

## 2023-04-15 DIAGNOSIS — C4401 Basal cell carcinoma of skin of lip: Secondary | ICD-10-CM

## 2023-04-16 ENCOUNTER — Inpatient Hospital Stay (HOSPITAL_BASED_OUTPATIENT_CLINIC_OR_DEPARTMENT_OTHER)
Admission: RE | Admit: 2023-04-16 | Discharge: 2023-04-16 | Disposition: A | Discharge status: Still In Hospital | Payer: Medicare HMO | Source: Ambulatory Visit | Attending: RADIATION ONCOLOGY | Admitting: RADIATION ONCOLOGY

## 2023-04-17 ENCOUNTER — Other Ambulatory Visit: Payer: Self-pay

## 2023-04-17 ENCOUNTER — Inpatient Hospital Stay (HOSPITAL_BASED_OUTPATIENT_CLINIC_OR_DEPARTMENT_OTHER)
Admission: RE | Admit: 2023-04-17 | Discharge: 2023-04-17 | Disposition: A | Discharge status: Still In Hospital | Payer: Medicare HMO | Source: Ambulatory Visit | Attending: RADIATION ONCOLOGY | Admitting: RADIATION ONCOLOGY

## 2023-04-20 ENCOUNTER — Inpatient Hospital Stay (HOSPITAL_BASED_OUTPATIENT_CLINIC_OR_DEPARTMENT_OTHER)
Admission: RE | Admit: 2023-04-20 | Discharge: 2023-04-20 | Disposition: A | Discharge status: Still In Hospital | Payer: Medicare HMO | Source: Ambulatory Visit | Attending: RADIATION ONCOLOGY

## 2023-04-20 ENCOUNTER — Other Ambulatory Visit: Payer: Self-pay

## 2023-04-20 DIAGNOSIS — C4401 Basal cell carcinoma of skin of lip: Secondary | ICD-10-CM | POA: Insufficient documentation

## 2023-04-20 MED ORDER — ONDANSETRON 4 MG DISINTEGRATING TABLET
4.0000 mg | ORAL_TABLET | Freq: Three times a day (TID) | ORAL | 0 refills | Status: AC | PRN
Start: 2023-04-20 — End: 2023-05-20

## 2023-04-20 NOTE — Progress Notes (Signed)
WEEKLY RADIATION THERAPY PROGRESS NOTE     Name: Kerry Williams MRN:  [id number]   Date: 04/20/2023 Age: 67 y.o.        WEIGHT:   Wt Readings from Last 1 Encounters:   04/03/22 63.5 kg (140 lb)        FRACTION:  4 of 16    DOSE:  12 Gy of 48 Gy    TUMOR:  Basal cell carcinoma of the lip    SUBJECTIVE:  Mild nausea    PHYSICAL EXAM:  Mild erythema  COMMENTS:  Continue XRT; add zofran

## 2023-04-20 NOTE — Addendum Note (Signed)
Encounter addended by: Hedy Jacob, [facility] on: 04/20/2023 10:09 AM   Actions taken: Charge Capture section accepted

## 2023-04-21 ENCOUNTER — Inpatient Hospital Stay (HOSPITAL_BASED_OUTPATIENT_CLINIC_OR_DEPARTMENT_OTHER)
Admission: RE | Admit: 2023-04-21 | Discharge: 2023-04-21 | Disposition: A | Discharge status: Still In Hospital | Payer: Medicare HMO | Source: Ambulatory Visit | Attending: RADIATION ONCOLOGY | Admitting: RADIATION ONCOLOGY

## 2023-04-22 ENCOUNTER — Inpatient Hospital Stay
Admission: RE | Admit: 2023-04-22 | Discharge: 2023-04-22 | Disposition: A | Discharge status: Still In Hospital | Payer: Medicare HMO | Source: Ambulatory Visit | Attending: RADIATION ONCOLOGY | Admitting: RADIATION ONCOLOGY

## 2023-04-22 ENCOUNTER — Other Ambulatory Visit: Payer: Self-pay

## 2023-04-22 DIAGNOSIS — C4401 Basal cell carcinoma of skin of lip: Secondary | ICD-10-CM

## 2023-04-27 ENCOUNTER — Other Ambulatory Visit: Payer: Self-pay

## 2023-04-27 ENCOUNTER — Inpatient Hospital Stay (HOSPITAL_BASED_OUTPATIENT_CLINIC_OR_DEPARTMENT_OTHER)
Admission: RE | Admit: 2023-04-27 | Discharge: 2023-04-27 | Disposition: A | Discharge status: Still In Hospital | Payer: Medicare HMO | Source: Ambulatory Visit | Attending: RADIATION ONCOLOGY | Admitting: RADIATION ONCOLOGY

## 2023-04-27 DIAGNOSIS — Z510 Encounter for antineoplastic radiation therapy: Secondary | ICD-10-CM | POA: Insufficient documentation

## 2023-04-27 DIAGNOSIS — L539 Erythematous condition, unspecified: Secondary | ICD-10-CM | POA: Insufficient documentation

## 2023-04-27 DIAGNOSIS — R110 Nausea: Secondary | ICD-10-CM | POA: Insufficient documentation

## 2023-04-27 DIAGNOSIS — K1379 Other lesions of oral mucosa: Secondary | ICD-10-CM | POA: Insufficient documentation

## 2023-04-27 DIAGNOSIS — C4401 Basal cell carcinoma of skin of lip: Secondary | ICD-10-CM | POA: Insufficient documentation

## 2023-04-27 NOTE — Progress Notes (Signed)
WEEKLY RADIATION THERAPY PROGRESS NOTE     Name: Kerry Williams MRN:  [id number]   Date: 04/27/2023 Age: 67 y.o.        WEIGHT:   Wt Readings from Last 1 Encounters:   04/03/22 63.5 kg (140 lb)        FRACTION:  7 of 16    DOSE:  21 Gy of 48 Gy    TUMOR:  Basal cell carcinoma of the lip    SUBJECTIVE:  Mild nausea    PHYSICAL EXAM:  Mild erythema  COMMENTS:  Continue XRT; add zofran

## 2023-04-28 ENCOUNTER — Ambulatory Visit (HOSPITAL_BASED_OUTPATIENT_CLINIC_OR_DEPARTMENT_OTHER)
Admission: RE | Admit: 2023-04-28 | Discharge: 2023-04-28 | Disposition: A | Discharge status: Still In Hospital | Payer: Medicare HMO | Source: Ambulatory Visit | Attending: RADIATION ONCOLOGY | Admitting: RADIATION ONCOLOGY

## 2023-04-29 ENCOUNTER — Other Ambulatory Visit: Payer: Self-pay

## 2023-04-29 ENCOUNTER — Inpatient Hospital Stay (HOSPITAL_BASED_OUTPATIENT_CLINIC_OR_DEPARTMENT_OTHER)
Admission: RE | Admit: 2023-04-29 | Discharge: 2023-04-29 | Disposition: A | Discharge status: Still In Hospital | Payer: Medicare HMO | Source: Ambulatory Visit | Attending: RADIATION ONCOLOGY | Admitting: RADIATION ONCOLOGY

## 2023-04-30 ENCOUNTER — Ambulatory Visit (HOSPITAL_BASED_OUTPATIENT_CLINIC_OR_DEPARTMENT_OTHER)
Admission: RE | Admit: 2023-04-30 | Discharge: 2023-04-30 | Disposition: A | Discharge status: Still In Hospital | Payer: Medicare HMO | Source: Ambulatory Visit | Attending: RADIATION ONCOLOGY | Admitting: RADIATION ONCOLOGY

## 2023-05-01 ENCOUNTER — Inpatient Hospital Stay (HOSPITAL_BASED_OUTPATIENT_CLINIC_OR_DEPARTMENT_OTHER)
Admission: RE | Admit: 2023-05-01 | Discharge: 2023-05-01 | Disposition: A | Discharge status: Still In Hospital | Payer: Medicare HMO | Source: Ambulatory Visit | Attending: RADIATION ONCOLOGY

## 2023-05-04 ENCOUNTER — Inpatient Hospital Stay (HOSPITAL_BASED_OUTPATIENT_CLINIC_OR_DEPARTMENT_OTHER)
Admission: RE | Admit: 2023-05-04 | Discharge: 2023-05-04 | Disposition: A | Discharge status: Still In Hospital | Payer: Medicare HMO | Source: Ambulatory Visit | Attending: RADIATION ONCOLOGY

## 2023-05-04 ENCOUNTER — Other Ambulatory Visit: Payer: Self-pay

## 2023-05-04 DIAGNOSIS — C4401 Basal cell carcinoma of skin of lip: Secondary | ICD-10-CM

## 2023-05-04 MED ORDER — LIDOCAINE HCL 2 % MUCOSAL SOLUTION
15.0000 mL | 1 refills | Status: AC | PRN
Start: 2023-05-04 — End: 2023-06-03

## 2023-05-04 NOTE — Progress Notes (Signed)
WEEKLY RADIATION THERAPY PROGRESS NOTE     Name: Kerry Williams MRN:  [id number]   Date: 05/04/2023 Age: 67 y.o.        WEIGHT:   Wt Readings from Last 1 Encounters:   04/03/22 63.5 kg (140 lb)        FRACTION:  12 of 16    DOSE:  36 Gy of 48 Gy    TUMOR:  Basal cell carcinoma of the lip    SUBJECTIVE:  Mild nausea; sore mouth    PHYSICAL EXAM:  Mild erythema  COMMENTS:  Continue XRT; add lidocaine

## 2023-05-05 ENCOUNTER — Inpatient Hospital Stay (HOSPITAL_BASED_OUTPATIENT_CLINIC_OR_DEPARTMENT_OTHER)
Admission: RE | Admit: 2023-05-05 | Discharge: 2023-05-05 | Disposition: A | Discharge status: Still In Hospital | Payer: Medicare HMO | Source: Ambulatory Visit | Attending: RADIATION ONCOLOGY

## 2023-05-06 ENCOUNTER — Other Ambulatory Visit: Payer: Self-pay

## 2023-05-06 ENCOUNTER — Inpatient Hospital Stay (HOSPITAL_BASED_OUTPATIENT_CLINIC_OR_DEPARTMENT_OTHER)
Admission: RE | Admit: 2023-05-06 | Discharge: 2023-05-06 | Disposition: A | Discharge status: Still In Hospital | Payer: Medicare HMO | Source: Ambulatory Visit | Attending: RADIATION ONCOLOGY | Admitting: RADIATION ONCOLOGY

## 2023-05-07 ENCOUNTER — Inpatient Hospital Stay (HOSPITAL_BASED_OUTPATIENT_CLINIC_OR_DEPARTMENT_OTHER)
Admission: RE | Admit: 2023-05-07 | Discharge: 2023-05-07 | Disposition: A | Discharge status: Still In Hospital | Payer: Medicare HMO | Source: Ambulatory Visit | Attending: RADIATION ONCOLOGY

## 2023-05-08 ENCOUNTER — Other Ambulatory Visit: Payer: Self-pay

## 2023-05-08 ENCOUNTER — Ambulatory Visit (HOSPITAL_BASED_OUTPATIENT_CLINIC_OR_DEPARTMENT_OTHER)
Admission: RE | Admit: 2023-05-08 | Discharge: 2023-05-08 | Disposition: A | Discharge status: Still In Hospital | Payer: Medicare HMO | Source: Ambulatory Visit | Attending: RADIATION ONCOLOGY | Admitting: RADIATION ONCOLOGY

## 2023-05-11 ENCOUNTER — Other Ambulatory Visit: Payer: Self-pay

## 2023-05-11 ENCOUNTER — Inpatient Hospital Stay
Admission: RE | Admit: 2023-05-11 | Discharge: 2023-05-11 | Disposition: A | Discharge status: Still In Hospital | Payer: Medicare HMO | Source: Ambulatory Visit | Attending: RADIATION ONCOLOGY | Admitting: RADIATION ONCOLOGY

## 2023-05-15 ENCOUNTER — Ambulatory Visit (HOSPITAL_BASED_OUTPATIENT_CLINIC_OR_DEPARTMENT_OTHER): Payer: Medicare HMO

## 2023-06-02 ENCOUNTER — Ambulatory Visit: Payer: Medicare HMO

## 2023-06-04 ENCOUNTER — Ambulatory Visit
Admission: RE | Admit: 2023-06-04 | Discharge: 2023-06-04 | Disposition: A | Discharge status: Still In Hospital | Payer: Medicare HMO | Source: Ambulatory Visit | Attending: RADIATION ONCOLOGY | Admitting: RADIATION ONCOLOGY

## 2023-06-04 ENCOUNTER — Other Ambulatory Visit: Payer: Self-pay

## 2023-06-04 DIAGNOSIS — Z08 Encounter for follow-up examination after completed treatment for malignant neoplasm: Secondary | ICD-10-CM | POA: Insufficient documentation

## 2023-06-04 DIAGNOSIS — Z923 Personal history of irradiation: Secondary | ICD-10-CM | POA: Insufficient documentation

## 2023-06-04 DIAGNOSIS — Z85819 Personal history of malignant neoplasm of unspecified site of lip, oral cavity, and pharynx: Secondary | ICD-10-CM | POA: Insufficient documentation

## 2023-06-04 DIAGNOSIS — L989 Disorder of the skin and subcutaneous tissue, unspecified: Secondary | ICD-10-CM | POA: Insufficient documentation

## 2023-06-04 DIAGNOSIS — C4401 Basal cell carcinoma of skin of lip: Secondary | ICD-10-CM

## 2023-06-04 NOTE — Progress Notes (Signed)
Name: Autiana Bault MRN:  [id number]   Date: 06/04/2023 Age: 68 y.o.       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  Now with new lesion on the left upper chest suspicious for basal cell carcinoma.    Recommendations:  Return to Dr. Ria Bush for consideration of biopsy of the chest lesion.  Follow up in our office on an as-needed basis.    The indications, time course, benefits, risks and side effects of radiation treatment were explained to the patient, and her questions were answered to her apparent satisfaction. I encouraged her to contact us at any time should she have any further questions or concerns. I personally saw and examined the patient, and reviewed all prior imaging and pathologic findings with her. I spent greater than 50% of a 20 minute visit in discussion of the patient's diagnosis and management.    FULL NOTE     Interval History :   Kerry Williams is a 68 y.o. female with a history of basal cell carcinoma of the left lip.  She has had multiple basal cell carcinomas removed by Dr. Ria Bush.  She completed definitive radiotherapy to the lip last month in his healed very well.  She has no complaints of pain.  She does have a new lesion in the left upper chest that is bothering her.    Pain Assessment:  None    Past Medical/Surgical History:  Past Medical History:   Diagnosis Date    Allergic rhinitis        Family History:   Family Medical History:    None           Social History:   Social History     Socioeconomic History    Marital status: Married     Spouse name: Not on file    Number of children: Not on file    Years of education: Not on file    Highest education level: Not on file   Occupational History    Not on file   Tobacco Use     Smoking status: Never    Smokeless tobacco: Never   Substance and Sexual Activity    Alcohol use: Not on file    Drug use: Not on file    Sexual activity: Not on file   Other Topics Concern    Not on file   Social History Narrative    Not on file     Social Determinants of Health     Financial Resource Strain: Not on file   Transportation Needs: Not on file   Social Connections: Not on file   Intimate Partner Violence: Not on file   Housing Stability: Not on file       ALLERGIES:   Allergies   Allergen Reactions    Levofloxacin  Other Adverse Reaction (Add comment) and Nausea/ Vomiting     pain  Other reaction(s): SICK          MEDICATIONS:   Current Outpatient Medications   Medication Instructions    albuterol sulfate (PROVENTIL) 2.5 mg /3 mL (0.083 %) Inhalation nebulizer solution 3 ml by nebulizer every 6 hours for 30 day(s)  alendronate (FOSAMAX) 70 mg Oral Tablet 1 tab(s) orally once a week    ALPRAZolam (XANAX) 1 mg Oral Tablet 1 tab(s) orally 3 times a day for 30 days    amitriptyline (ELAVIL) 25 mg Oral Tablet amitriptyline 25 mg tablet    cholestyramine-aspartame (PREVALITE) 4 gram Oral Powder in Packet Cholestyramine Light 4 gram powder for susp in a packet    diflunisaL (DOLOBID) 500 mg Oral Tablet diflunisal 500 mg tablet    DULoxetine (CYMBALTA DR) 60 mg Oral Capsule, Delayed Release(E.C.) 1 cap(s) orally once a day for 90 days    ipratropium-albuterol 0.5 mg-3 mg(2.5 mg base)/3 mL Solution for Nebulization ipratropium 0.5 mg-albuterol 3 mg (2.5 mg base)/3 mL nebulization soln    montelukast (SINGULAIR) 10 mg, Oral, DAILY    pantoprazole (PROTONIX) 40 mg Oral Tablet, Delayed Release (E.C.) 1 tab(s) orally BID for 90 days    TRELEGY ELLIPTA 100-62.5-25 mcg Inhalation Disk with Device No dose, route, or frequency recorded.        REVIEW OF SYSTEMS  Pertinent review of systems as discussed in Interval History.      Objective:   There were no vitals filed for this visit.          PHYSICAL  EXAMINATION  Physical Exam  Constitutional:       Appearance: Normal appearance.   HENT:      Head: Normocephalic.      Comments: Previously treated area in the left upper lip healed nicely minimal residual erythema  Eyes:      Extraocular Movements: Extraocular movements intact.      Pupils: Pupils are equal, round, and reactive to light.   Pulmonary:      Effort: Pulmonary effort is normal.   Musculoskeletal:         General: Normal range of motion.      Cervical back: Normal range of motion.   Skin:     Comments: 1 cm raised nodular lesion on the left upper chest   Neurological:      General: No focal deficit present.      Mental Status: She is alert and oriented to person, place, and time.   Psychiatric:         Mood and Affect: Mood normal.          LABS/IMAGING: All relevant labs and imaging were reviewed as per HPI.      Molli Barrows, MD 06/04/2023, 08:40    YN:[id number]@
